# Patient Record
Sex: Female | Born: 1970 | Race: Black or African American | Hispanic: No | Marital: Single | Smoking: Current every day smoker
Health system: Southern US, Community
[De-identification: ages and names within clinical notes are randomized; demographics above are authoritative.]

## PROBLEM LIST (undated history)

## (undated) DIAGNOSIS — R32 Unspecified urinary incontinence: Secondary | ICD-10-CM

## (undated) DIAGNOSIS — Z720 Tobacco use: Secondary | ICD-10-CM

## (undated) DIAGNOSIS — F3131 Bipolar disorder, current episode depressed, mild: Secondary | ICD-10-CM

## (undated) DIAGNOSIS — L720 Epidermal cyst: Secondary | ICD-10-CM

## (undated) DIAGNOSIS — M1990 Unspecified osteoarthritis, unspecified site: Secondary | ICD-10-CM

## (undated) DIAGNOSIS — B9681 Helicobacter pylori [H. pylori] as the cause of diseases classified elsewhere: Secondary | ICD-10-CM

## (undated) DIAGNOSIS — F32A Depression, unspecified: Secondary | ICD-10-CM

## (undated) DIAGNOSIS — E785 Hyperlipidemia, unspecified: Secondary | ICD-10-CM

## (undated) DIAGNOSIS — K219 Gastro-esophageal reflux disease without esophagitis: Secondary | ICD-10-CM

## (undated) DIAGNOSIS — G629 Polyneuropathy, unspecified: Secondary | ICD-10-CM

## (undated) DIAGNOSIS — F419 Anxiety disorder, unspecified: Secondary | ICD-10-CM

## (undated) DIAGNOSIS — K589 Irritable bowel syndrome without diarrhea: Secondary | ICD-10-CM

## (undated) DIAGNOSIS — F329 Major depressive disorder, single episode, unspecified: Secondary | ICD-10-CM

## (undated) DIAGNOSIS — R0600 Dyspnea, unspecified: Secondary | ICD-10-CM

## (undated) DIAGNOSIS — E538 Deficiency of other specified B group vitamins: Secondary | ICD-10-CM

## (undated) DIAGNOSIS — K2970 Gastritis, unspecified, without bleeding: Secondary | ICD-10-CM

## (undated) DIAGNOSIS — D649 Anemia, unspecified: Secondary | ICD-10-CM

## (undated) DIAGNOSIS — J189 Pneumonia, unspecified organism: Secondary | ICD-10-CM

## (undated) DIAGNOSIS — E559 Vitamin D deficiency, unspecified: Secondary | ICD-10-CM

## (undated) DIAGNOSIS — J449 Chronic obstructive pulmonary disease, unspecified: Secondary | ICD-10-CM

## (undated) DIAGNOSIS — R7303 Prediabetes: Secondary | ICD-10-CM

## (undated) DIAGNOSIS — J45909 Unspecified asthma, uncomplicated: Secondary | ICD-10-CM

## (undated) HISTORY — PX: INCISE AND DRAIN ABCESS: PRO64

---

## 1997-11-23 ENCOUNTER — Inpatient Hospital Stay (HOSPITAL_COMMUNITY): Admission: AD | Admit: 1997-11-23 | Discharge: 1997-11-23 | Payer: Self-pay | Admitting: Obstetrics & Gynecology

## 1997-12-01 ENCOUNTER — Inpatient Hospital Stay (HOSPITAL_COMMUNITY): Admission: AD | Admit: 1997-12-01 | Discharge: 1997-12-01 | Payer: Self-pay | Admitting: Obstetrics

## 1997-12-07 ENCOUNTER — Encounter (HOSPITAL_COMMUNITY): Admission: RE | Admit: 1997-12-07 | Discharge: 1998-03-07 | Payer: Self-pay | Admitting: *Deleted

## 1997-12-16 ENCOUNTER — Inpatient Hospital Stay (HOSPITAL_COMMUNITY): Admission: AD | Admit: 1997-12-16 | Discharge: 1997-12-16 | Payer: Self-pay | Admitting: Obstetrics & Gynecology

## 1997-12-16 ENCOUNTER — Encounter: Admission: RE | Admit: 1997-12-16 | Discharge: 1998-03-16 | Payer: Self-pay | Admitting: Obstetrics

## 1997-12-28 ENCOUNTER — Inpatient Hospital Stay (HOSPITAL_COMMUNITY): Admission: AD | Admit: 1997-12-28 | Discharge: 1998-01-04 | Payer: Self-pay | Admitting: Obstetrics & Gynecology

## 1998-01-14 ENCOUNTER — Inpatient Hospital Stay (HOSPITAL_COMMUNITY): Admission: AD | Admit: 1998-01-14 | Discharge: 1998-01-14 | Payer: Self-pay | Admitting: *Deleted

## 2000-01-15 ENCOUNTER — Encounter: Payer: Self-pay | Admitting: Obstetrics

## 2000-01-15 ENCOUNTER — Inpatient Hospital Stay (HOSPITAL_COMMUNITY): Admission: AD | Admit: 2000-01-15 | Discharge: 2000-01-15 | Payer: Self-pay | Admitting: Obstetrics

## 2004-05-22 ENCOUNTER — Emergency Department: Payer: Self-pay | Admitting: Emergency Medicine

## 2005-02-19 ENCOUNTER — Emergency Department: Payer: Self-pay | Admitting: Emergency Medicine

## 2006-10-29 ENCOUNTER — Emergency Department: Payer: Self-pay | Admitting: Emergency Medicine

## 2007-07-03 ENCOUNTER — Emergency Department: Payer: Self-pay | Admitting: Emergency Medicine

## 2008-03-24 ENCOUNTER — Emergency Department: Payer: Self-pay | Admitting: Emergency Medicine

## 2008-07-20 ENCOUNTER — Emergency Department: Payer: Self-pay | Admitting: Emergency Medicine

## 2008-08-22 ENCOUNTER — Emergency Department: Payer: Self-pay | Admitting: Emergency Medicine

## 2009-01-17 ENCOUNTER — Emergency Department: Payer: Self-pay | Admitting: Emergency Medicine

## 2009-02-03 ENCOUNTER — Ambulatory Visit: Payer: Self-pay | Admitting: Surgery

## 2009-02-07 ENCOUNTER — Ambulatory Visit: Payer: Self-pay | Admitting: Surgery

## 2009-07-04 ENCOUNTER — Emergency Department: Payer: Self-pay | Admitting: Emergency Medicine

## 2009-07-07 ENCOUNTER — Emergency Department: Payer: Self-pay | Admitting: Emergency Medicine

## 2009-10-31 ENCOUNTER — Emergency Department: Payer: Self-pay | Admitting: Unknown Physician Specialty

## 2010-01-09 ENCOUNTER — Emergency Department: Payer: Self-pay | Admitting: Emergency Medicine

## 2010-03-10 ENCOUNTER — Emergency Department: Payer: Self-pay | Admitting: Emergency Medicine

## 2010-03-30 ENCOUNTER — Ambulatory Visit: Payer: Self-pay | Admitting: Otolaryngology

## 2010-10-02 ENCOUNTER — Emergency Department: Payer: Self-pay | Admitting: Internal Medicine

## 2011-03-16 ENCOUNTER — Emergency Department: Payer: Self-pay | Admitting: Emergency Medicine

## 2013-11-01 ENCOUNTER — Emergency Department: Payer: Self-pay | Admitting: Emergency Medicine

## 2014-01-01 ENCOUNTER — Emergency Department: Payer: Self-pay | Admitting: Emergency Medicine

## 2014-02-05 ENCOUNTER — Emergency Department: Payer: Self-pay | Admitting: Emergency Medicine

## 2014-02-05 LAB — CBC WITH DIFFERENTIAL/PLATELET
Basophil #: 0.1 10*3/uL (ref 0.0–0.1)
Basophil %: 0.7 %
Eosinophil #: 0.1 10*3/uL (ref 0.0–0.7)
Eosinophil %: 0.9 %
HCT: 38.9 % (ref 35.0–47.0)
HGB: 12.9 g/dL (ref 12.0–16.0)
Lymphocyte #: 3 10*3/uL (ref 1.0–3.6)
Lymphocyte %: 26.5 %
MCH: 31.8 pg (ref 26.0–34.0)
MCHC: 33.1 g/dL (ref 32.0–36.0)
MCV: 96 fL (ref 80–100)
Monocyte #: 0.5 x10 3/mm (ref 0.2–0.9)
Monocyte %: 4.6 %
Neutrophil #: 7.7 10*3/uL — ABNORMAL HIGH (ref 1.4–6.5)
Neutrophil %: 67.3 %
Platelet: 338 10*3/uL (ref 150–440)
RBC: 4.06 10*6/uL (ref 3.80–5.20)
RDW: 12.4 % (ref 11.5–14.5)
WBC: 11.4 10*3/uL — ABNORMAL HIGH (ref 3.6–11.0)

## 2014-02-05 LAB — URINALYSIS, COMPLETE
Bacteria: NONE SEEN
Bilirubin,UR: NEGATIVE
Blood: NEGATIVE
Glucose,UR: NEGATIVE mg/dL (ref 0–75)
Ketone: NEGATIVE
Leukocyte Esterase: NEGATIVE
Nitrite: NEGATIVE
Ph: 6 (ref 4.5–8.0)
Protein: NEGATIVE
RBC,UR: 2 /HPF (ref 0–5)
Specific Gravity: 1.029 (ref 1.003–1.030)
Squamous Epithelial: 9
WBC UR: 1 /HPF (ref 0–5)

## 2014-02-05 LAB — COMPREHENSIVE METABOLIC PANEL
Albumin: 3.3 g/dL — ABNORMAL LOW (ref 3.4–5.0)
Alkaline Phosphatase: 59 U/L
Anion Gap: 6 — ABNORMAL LOW (ref 7–16)
BUN: 7 mg/dL (ref 7–18)
Bilirubin,Total: 0.3 mg/dL (ref 0.2–1.0)
Calcium, Total: 8.8 mg/dL (ref 8.5–10.1)
Chloride: 106 mmol/L (ref 98–107)
Co2: 26 mmol/L (ref 21–32)
Creatinine: 0.89 mg/dL (ref 0.60–1.30)
EGFR (African American): >60
EGFR (Non-African Amer.): >60
Glucose: 108 mg/dL — ABNORMAL HIGH (ref 65–99)
Osmolality: 274 (ref 275–301)
Potassium: 3.6 mmol/L (ref 3.5–5.1)
SGOT(AST): 12 U/L — ABNORMAL LOW (ref 15–37)
SGPT (ALT): 15 U/L
Sodium: 138 mmol/L (ref 136–145)
Total Protein: 7.3 g/dL (ref 6.4–8.2)

## 2014-02-05 LAB — LIPASE, BLOOD: Lipase: 128 U/L (ref 73–393)

## 2014-02-05 LAB — PREGNANCY, URINE: Pregnancy Test, Urine: NEGATIVE m[IU]/mL

## 2014-06-01 ENCOUNTER — Ambulatory Visit: Payer: Self-pay | Admitting: Nurse Practitioner

## 2014-09-08 ENCOUNTER — Emergency Department: Payer: Self-pay | Admitting: Emergency Medicine

## 2015-05-25 ENCOUNTER — Encounter: Payer: Self-pay | Admitting: *Deleted

## 2015-05-25 ENCOUNTER — Emergency Department
Admission: EM | Admit: 2015-05-25 | Discharge: 2015-05-25 | Disposition: A | Discharge status: Home / Self Care | Payer: Medicaid Other | Attending: Emergency Medicine | Admitting: Emergency Medicine

## 2015-05-25 ENCOUNTER — Emergency Department: Payer: Medicaid Other

## 2015-05-25 DIAGNOSIS — R1011 Right upper quadrant pain: Secondary | ICD-10-CM | POA: Diagnosis present

## 2015-05-25 DIAGNOSIS — F329 Major depressive disorder, single episode, unspecified: Secondary | ICD-10-CM | POA: Insufficient documentation

## 2015-05-25 DIAGNOSIS — K2970 Gastritis, unspecified, without bleeding: Secondary | ICD-10-CM | POA: Diagnosis not present

## 2015-05-25 DIAGNOSIS — Z79899 Other long term (current) drug therapy: Secondary | ICD-10-CM | POA: Insufficient documentation

## 2015-05-25 DIAGNOSIS — Z880 Allergy status to penicillin: Secondary | ICD-10-CM | POA: Diagnosis not present

## 2015-05-25 DIAGNOSIS — F17210 Nicotine dependence, cigarettes, uncomplicated: Secondary | ICD-10-CM | POA: Diagnosis not present

## 2015-05-25 HISTORY — DX: Irritable bowel syndrome, unspecified: K58.9

## 2015-05-25 HISTORY — DX: Major depressive disorder, single episode, unspecified: F32.9

## 2015-05-25 HISTORY — DX: Depression, unspecified: F32.A

## 2015-05-25 LAB — COMPREHENSIVE METABOLIC PANEL
ALBUMIN: 3.5 g/dL (ref 3.5–5.0)
ALT: 11 U/L — AB (ref 14–54)
AST: 20 U/L (ref 15–41)
Alkaline Phosphatase: 45 U/L (ref 38–126)
Anion gap: 5 (ref 5–15)
BILIRUBIN TOTAL: 0.6 mg/dL (ref 0.3–1.2)
BUN: 7 mg/dL (ref 6–20)
CHLORIDE: 106 mmol/L (ref 101–111)
CO2: 27 mmol/L (ref 22–32)
CREATININE: 0.76 mg/dL (ref 0.44–1.00)
Calcium: 8.8 mg/dL — ABNORMAL LOW (ref 8.9–10.3)
GFR calc Af Amer: >60 mL/min (ref >60)
GLUCOSE: 94 mg/dL (ref 65–99)
POTASSIUM: 3.8 mmol/L (ref 3.5–5.1)
Sodium: 138 mmol/L (ref 135–145)
Total Protein: 6.5 g/dL (ref 6.5–8.1)

## 2015-05-25 LAB — LIPASE, BLOOD: LIPASE: 20 U/L (ref 11–51)

## 2015-05-25 LAB — URINALYSIS COMPLETE WITH MICROSCOPIC (ARMC ONLY)
BACTERIA UA: NONE SEEN
Bilirubin Urine: NEGATIVE
Glucose, UA: NEGATIVE mg/dL
HGB URINE DIPSTICK: NEGATIVE
Ketones, ur: NEGATIVE mg/dL
Leukocytes, UA: NEGATIVE
Nitrite: NEGATIVE
PH: 7 (ref 5.0–8.0)
PROTEIN: NEGATIVE mg/dL
Specific Gravity, Urine: 1.021 (ref 1.005–1.030)

## 2015-05-25 LAB — CBC
HCT: 40.7 % (ref 35.0–47.0)
HEMOGLOBIN: 13.4 g/dL (ref 12.0–16.0)
MCH: 31.2 pg (ref 26.0–34.0)
MCHC: 33 g/dL (ref 32.0–36.0)
MCV: 94.6 fL (ref 80.0–100.0)
PLATELETS: 262 10*3/uL (ref 150–440)
RBC: 4.3 MIL/uL (ref 3.80–5.20)
RDW: 13 % (ref 11.5–14.5)
WBC: 9.8 10*3/uL (ref 3.6–11.0)

## 2015-05-25 MED ORDER — ONDANSETRON 8 MG PO TBDP: 8.0000 mg | ORAL_TABLET | Freq: Three times a day (TID) | ORAL | Status: DC | PRN

## 2015-05-25 MED ORDER — RANITIDINE HCL 150 MG PO CAPS: 150.0000 mg | ORAL_CAPSULE | Freq: Two times a day (BID) | ORAL | Status: DC

## 2015-05-25 MED ORDER — HYDROMORPHONE HCL 1 MG/ML IJ SOLN
1.0000 mg | Freq: Once | INTRAMUSCULAR | Status: AC
Start: 2015-05-25 — End: 2015-05-25
  Administered 2015-05-25: 1 mg via INTRAVENOUS
  Filled 2015-05-25: qty 1

## 2015-05-25 MED ORDER — SODIUM CHLORIDE 0.9 % IV BOLUS (SEPSIS)
1000.0000 mL | Freq: Once | INTRAVENOUS | Status: AC
  Administered 2015-05-25: 1000 mL via INTRAVENOUS

## 2015-05-25 MED ORDER — FAMOTIDINE 20 MG PO TABS
40.0000 mg | ORAL_TABLET | Freq: Once | ORAL | Status: AC
  Administered 2015-05-25: 40 mg via ORAL
  Filled 2015-05-25: qty 2

## 2015-05-25 MED ORDER — ONDANSETRON HCL 4 MG/2ML IJ SOLN
4.0000 mg | Freq: Once | INTRAMUSCULAR | Status: AC
  Administered 2015-05-25: 4 mg via INTRAVENOUS
  Filled 2015-05-25: qty 2

## 2015-05-25 MED ORDER — GI COCKTAIL ~~LOC~~
30.0000 mL | ORAL | Status: AC
  Administered 2015-05-25: 30 mL via ORAL
  Filled 2015-05-25: qty 30

## 2015-05-25 NOTE — ED Notes (Signed)
Patient to ED with 2 month hx/o right sided abd pain and vomiting after eating, Patient states that she has a hx/o IBS, MD changed her medication 1 week ago and the pain has gotten worsen.

## 2015-05-25 NOTE — ED Provider Notes (Addendum)
[facility] Emergency Department Provider Note  ____________________________________________  Time seen: 9:50 AM  I have reviewed the triage vital signs and the nursing notes.   HISTORY  Chief Complaint Abdominal Pain    HPI Kim Howard is a 44 y.o. female who complains of right upper quadrant abdominal pain for the past 2 months. It is aching, nonradiating, worse with eating and associated with vomiting after eating. She does have a history of IBS but states this is worse than she generally experiences. She takes are equal and Latuda chronically for many years, without any changes in her regimen. No other complaints. No chest pain or shortness of breath, no fevers chills dizziness or syncope.     Past Medical History  Diagnosis Date  . Depression   . Irritable bowel syndrome (IBS)      There are no active problems to display for this patient.    History reviewed. No pertinent past surgical history.   Current Outpatient Rx  Name  Route  Sig  Dispense  Refill  . lubiprostone (AMITIZA) 24 MCG capsule   Oral   Take 24 mcg by mouth every 12 (twelve) hours.         Marland Kitchen lurasidone (LATUDA) 40 MG TABS tablet   Oral   Take 40 mg by mouth daily.         . QUEtiapine Fumarate (SEROQUEL XR) 150 MG 24 hr tablet   Oral   Take 150 mg by mouth at bedtime.         . ondansetron (ZOFRAN ODT) 8 MG disintegrating tablet   Oral   Take 1 tablet (8 mg total) by mouth every 8 (eight) hours as needed for nausea or vomiting.   20 tablet   0   . ranitidine (ZANTAC) 150 MG capsule   Oral   Take 1 capsule (150 mg total) by mouth 2 (two) times daily.   28 capsule   0      Allergies Penicillins   No family history on file.  Social History Social History  Substance Use Topics  . Smoking status: Current Every Day Smoker -- 1.00 packs/day    Types: Cigarettes  . Smokeless tobacco: None  . Alcohol Use: No    Review of  Systems  Constitutional:   No fever or chills. No weight changes Eyes:   No blurry vision or double vision.  ENT:   No sore throat. Cardiovascular:   No chest pain. Respiratory:   No dyspnea or cough. Gastrointestinal:   Positive as above for abdominal pain, with vomiting without diarrhea.  No BRBPR or melena. Genitourinary:   Negative for dysuria, urinary retention, bloody urine, or difficulty urinating. Musculoskeletal:   Negative for back pain. No joint swelling or pain. Skin:   Negative for rash. Neurological:   Negative for headaches, focal weakness or numbness. Psychiatric:  No anxiety , chronic controlled depression.  No SI or HI or hallucinations Endocrine:  No hot/cold intolerance, changes in energy, or sleep difficulty.  10-point ROS otherwise negative.  ____________________________________________   PHYSICAL EXAM:  VITAL SIGNS: ED Triage Vitals  Enc Vitals Group     BP 05/25/15 0852 104/66 mmHg     Pulse Rate 05/25/15 0852 84     Resp 05/25/15 0852 18     Temp 05/25/15 0852 97.7 F (36.5 C)     Temp Source 05/25/15 0852 Oral     SpO2 05/25/15 0852 99 %     Weight 05/25/15 0852  136 lb (61.689 kg)     Height 05/25/15 0852 5\' 7"  (1.702 m)     Head Cir --      Peak Flow --      Pain Score 05/25/15 0857 9     Pain Loc --      Pain Edu? --      Excl. in GC? --      Constitutional:   Alert and oriented. Well appearing and in no distress. Eyes:   No scleral icterus. No conjunctival pallor. PERRL. EOMI ENT   Head:   Normocephalic and atraumatic.   Nose:   No congestion/rhinnorhea. No septal hematoma   Mouth/Throat:   MMM, no pharyngeal erythema. No peritonsillar mass. No uvula shift.   Neck:   No stridor. No SubQ emphysema. No meningismus. Hematological/Lymphatic/Immunilogical:   No cervical lymphadenopathy. Cardiovascular:   RRR. Normal and symmetric distal pulses are present in all extremities. No murmurs, rubs, or gallops. Respiratory:   Normal  respiratory effort without tachypnea nor retractions. Breath sounds are clear and equal bilaterally. No wheezes/rales/rhonchi. Gastrointestinal:   Soft with focal right upper quadrant tenderness.. No distention. There is no CVA tenderness.  No rebound, rigidity, or guarding. Genitourinary:   deferred Musculoskeletal:   Nontender with normal range of motion in all extremities. No joint effusions.  No lower extremity tenderness.  No edema. Neurologic:   Normal speech and language.  CN 2-10 normal. Motor grossly intact. No pronator drift.  Normal gait. No gross focal neurologic deficits are appreciated.  Skin:    Skin is warm, dry and intact. No rash noted.  No petechiae, purpura, or bullae. Psychiatric:   Mood and affect are normal. Speech and behavior are normal. Patient exhibits appropriate insight and judgment.  ____________________________________________    LABS (pertinent positives/negatives) (all labs ordered are listed, but only abnormal results are displayed) Labs Reviewed  COMPREHENSIVE METABOLIC PANEL - Abnormal; Notable for the following:    Calcium 8.8 (*)    ALT 11 (*)    All other components within normal limits  URINALYSIS COMPLETEWITH MICROSCOPIC ([facility] ONLY) - Abnormal; Notable for the following:    Color, Urine YELLOW (*)    APPearance CLEAR (*)    Squamous Epithelial / LPF 0-5 (*)    All other components within normal limits  CBC  LIPASE, BLOOD   ____________________________________________   EKG    ____________________________________________    RADIOLOGY  Ultrasound right upper quadrant unremarkable except for slight biliary sludge  ____________________________________________   PROCEDURES   ____________________________________________   INITIAL IMPRESSION / ASSESSMENT AND PLAN / ED COURSE  Pertinent labs & imaging results that were available during my care of the patient were reviewed by me and considered in my medical decision making (see  chart for details).  Patient presents with postprandial pain and right upper quadrant tenderness. Concerning for biliary colic. No evidence of cholecystitis or sepsis at this time. We'll check labs and ultrasound. IV Dilaudid and Zofran for symptom relief at the time. We will also give a trial of antacids. ----------------------------------------- 11:39 AM on 05/25/2015 -----------------------------------------  Workup negative. Vital signs stable and normal. We'll treat as gastritis with a trial of antacids and anti-emetics and have her follow up with primary care. No evidence of obstruction perforation cholangitis cholecystitis. Low suspicion for torsion or STI. Feels much better after an antacids.   ____________________________________________   FINAL CLINICAL IMPRESSION(S) / ED DIAGNOSES  Final diagnoses:  RUQ abdominal pain  Gastritis      Sharman CheekPhillip Zavon Hyson,  MD 05/25/15 1139  Sharman Cheek, MD 05/25/15 [phone]

## 2015-05-25 NOTE — Discharge Instructions (Signed)

## 2015-05-25 NOTE — ED Notes (Signed)
Pt reports having irritable bowel syndrome, pt complains of right lower abdominal pain,

## 2016-10-15 ENCOUNTER — Encounter: Payer: Self-pay | Admitting: *Deleted

## 2016-10-15 ENCOUNTER — Emergency Department: Payer: Medicaid Other

## 2016-10-15 ENCOUNTER — Emergency Department
Admission: EM | Admit: 2016-10-15 | Discharge: 2016-10-15 | Disposition: A | Discharge status: Home / Self Care | Payer: Medicaid Other | Attending: Emergency Medicine | Admitting: Emergency Medicine

## 2016-10-15 DIAGNOSIS — S39012A Strain of muscle, fascia and tendon of lower back, initial encounter: Secondary | ICD-10-CM | POA: Diagnosis not present

## 2016-10-15 DIAGNOSIS — Y92410 Unspecified street and highway as the place of occurrence of the external cause: Secondary | ICD-10-CM | POA: Diagnosis not present

## 2016-10-15 DIAGNOSIS — Z79899 Other long term (current) drug therapy: Secondary | ICD-10-CM | POA: Insufficient documentation

## 2016-10-15 DIAGNOSIS — Y999 Unspecified external cause status: Secondary | ICD-10-CM | POA: Diagnosis not present

## 2016-10-15 DIAGNOSIS — S3992XA Unspecified injury of lower back, initial encounter: Secondary | ICD-10-CM | POA: Diagnosis present

## 2016-10-15 DIAGNOSIS — Y9355 Activity, bike riding: Secondary | ICD-10-CM | POA: Insufficient documentation

## 2016-10-15 DIAGNOSIS — F17210 Nicotine dependence, cigarettes, uncomplicated: Secondary | ICD-10-CM | POA: Insufficient documentation

## 2016-10-15 LAB — POC URINE PREG, ED: PREG TEST UR: NEGATIVE

## 2016-10-15 MED ORDER — METHOCARBAMOL 500 MG PO TABS: 500.0000 mg | ORAL_TABLET | Freq: Four times a day (QID) | ORAL | 0 refills | Status: DC

## 2016-10-15 MED ORDER — MELOXICAM 15 MG PO TABS: 15.0000 mg | ORAL_TABLET | Freq: Every day | ORAL | 0 refills | Status: DC

## 2016-10-15 MED ORDER — KETOROLAC TROMETHAMINE 30 MG/ML IJ SOLN
30.0000 mg | Freq: Once | INTRAMUSCULAR | Status: AC
  Administered 2016-10-15: 30 mg via INTRAMUSCULAR
  Filled 2016-10-15: qty 1

## 2016-10-15 MED ORDER — ORPHENADRINE CITRATE 30 MG/ML IJ SOLN
60.0000 mg | Freq: Once | INTRAMUSCULAR | Status: AC
  Administered 2016-10-15: 60 mg via INTRAMUSCULAR
  Filled 2016-10-15: qty 2
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
## 2016-10-26 ENCOUNTER — Encounter: Payer: Self-pay | Admitting: Emergency Medicine

## 2016-10-26 ENCOUNTER — Emergency Department: Payer: Medicaid Other

## 2016-10-26 ENCOUNTER — Emergency Department
Admission: EM | Admit: 2016-10-26 | Discharge: 2016-10-26 | Disposition: A | Discharge status: Home / Self Care | Payer: Medicaid Other | Attending: Emergency Medicine | Admitting: Emergency Medicine

## 2016-10-26 DIAGNOSIS — S62306A Unspecified fracture of fifth metacarpal bone, right hand, initial encounter for closed fracture: Secondary | ICD-10-CM | POA: Diagnosis not present

## 2016-10-26 DIAGNOSIS — Y9389 Activity, other specified: Secondary | ICD-10-CM | POA: Insufficient documentation

## 2016-10-26 DIAGNOSIS — Y929 Unspecified place or not applicable: Secondary | ICD-10-CM | POA: Diagnosis not present

## 2016-10-26 DIAGNOSIS — W2201XA Walked into wall, initial encounter: Secondary | ICD-10-CM | POA: Insufficient documentation

## 2016-10-26 DIAGNOSIS — S62339A Displaced fracture of neck of unspecified metacarpal bone, initial encounter for closed fracture: Secondary | ICD-10-CM

## 2016-10-26 DIAGNOSIS — Z79899 Other long term (current) drug therapy: Secondary | ICD-10-CM | POA: Diagnosis not present

## 2016-10-26 DIAGNOSIS — F17210 Nicotine dependence, cigarettes, uncomplicated: Secondary | ICD-10-CM | POA: Insufficient documentation

## 2016-10-26 DIAGNOSIS — Y999 Unspecified external cause status: Secondary | ICD-10-CM | POA: Diagnosis not present

## 2016-10-26 DIAGNOSIS — S6991XA Unspecified injury of right wrist, hand and finger(s), initial encounter: Secondary | ICD-10-CM | POA: Diagnosis present

## 2016-10-26 MED ORDER — MELOXICAM 15 MG PO TABS: 15.0000 mg | ORAL_TABLET | Freq: Every day | ORAL | 0 refills | Status: DC

## 2016-10-26 MED ORDER — HYDROCODONE-ACETAMINOPHEN 5-325 MG PO TABS: 1.0000 | ORAL_TABLET | ORAL | 0 refills | Status: DC | PRN

## 2016-10-26 MED ORDER — HYDROMORPHONE HCL 1 MG/ML IJ SOLN
1.0000 mg | Freq: Once | INTRAMUSCULAR | Status: AC
  Administered 2016-10-26: 1 mg via INTRAMUSCULAR
  Filled 2016-10-26: qty 1
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
## 2016-11-22 ENCOUNTER — Ambulatory Visit: Payer: Medicaid Other | Attending: Physical Therapy | Admitting: Physical Therapy

## 2017-01-12 DIAGNOSIS — F17210 Nicotine dependence, cigarettes, uncomplicated: Secondary | ICD-10-CM | POA: Diagnosis not present

## 2017-01-12 DIAGNOSIS — N739 Female pelvic inflammatory disease, unspecified: Secondary | ICD-10-CM | POA: Diagnosis not present

## 2017-01-12 DIAGNOSIS — N939 Abnormal uterine and vaginal bleeding, unspecified: Secondary | ICD-10-CM | POA: Diagnosis not present

## 2017-01-12 DIAGNOSIS — R1030 Lower abdominal pain, unspecified: Secondary | ICD-10-CM | POA: Insufficient documentation

## 2017-01-12 DIAGNOSIS — Z791 Long term (current) use of non-steroidal anti-inflammatories (NSAID): Secondary | ICD-10-CM | POA: Diagnosis not present

## 2017-01-12 DIAGNOSIS — Z79899 Other long term (current) drug therapy: Secondary | ICD-10-CM | POA: Diagnosis not present

## 2017-01-12 LAB — URINALYSIS, COMPLETE (UACMP) WITH MICROSCOPIC
BACTERIA UA: NONE SEEN
Bilirubin Urine: NEGATIVE
Glucose, UA: NEGATIVE mg/dL
Ketones, ur: NEGATIVE mg/dL
Leukocytes, UA: NEGATIVE
NITRITE: NEGATIVE
Protein, ur: NEGATIVE mg/dL
SPECIFIC GRAVITY, URINE: 1.023 (ref 1.005–1.030)
pH: 5 (ref 5.0–8.0)

## 2017-01-12 LAB — COMPREHENSIVE METABOLIC PANEL
ALBUMIN: 3.8 g/dL (ref 3.5–5.0)
ALK PHOS: 60 U/L (ref 38–126)
ALT: 16 U/L (ref 14–54)
AST: 24 U/L (ref 15–41)
Anion gap: 10 (ref 5–15)
BILIRUBIN TOTAL: 0.4 mg/dL (ref 0.3–1.2)
BUN: 9 mg/dL (ref 6–20)
CALCIUM: 9.3 mg/dL (ref 8.9–10.3)
CO2: 23 mmol/L (ref 22–32)
CREATININE: 0.62 mg/dL (ref 0.44–1.00)
Chloride: 105 mmol/L (ref 101–111)
GFR calc Af Amer: >60 mL/min (ref >60)
GFR calc non Af Amer: >60 mL/min (ref >60)
GLUCOSE: 93 mg/dL (ref 65–99)
Potassium: 3.3 mmol/L — ABNORMAL LOW (ref 3.5–5.1)
Sodium: 138 mmol/L (ref 135–145)
TOTAL PROTEIN: 7.3 g/dL (ref 6.5–8.1)

## 2017-01-12 LAB — CBC
HEMATOCRIT: 40.1 % (ref 35.0–47.0)
Hemoglobin: 13.8 g/dL (ref 12.0–16.0)
MCH: 32.9 pg (ref 26.0–34.0)
MCHC: 34.4 g/dL (ref 32.0–36.0)
MCV: 95.7 fL (ref 80.0–100.0)
PLATELETS: 275 10*3/uL (ref 150–440)
RBC: 4.19 MIL/uL (ref 3.80–5.20)
RDW: 12 % (ref 11.5–14.5)
WBC: 13.2 10*3/uL — ABNORMAL HIGH (ref 3.6–11.0)

## 2017-01-12 LAB — POCT PREGNANCY, URINE: PREG TEST UR: NEGATIVE

## 2017-01-12 MED ORDER — FENTANYL CITRATE (PF) 100 MCG/2ML IJ SOLN
INTRAMUSCULAR | Status: AC
  Filled 2017-01-12: qty 2

## 2017-01-12 MED ORDER — FENTANYL CITRATE (PF) 100 MCG/2ML IJ SOLN
50.0000 ug | INTRAMUSCULAR | Status: DC | PRN
  Administered 2017-01-12: 50 ug via INTRAVENOUS

## 2017-01-12 MED ORDER — ONDANSETRON HCL 4 MG/2ML IJ SOLN
4.0000 mg | Freq: Once | INTRAMUSCULAR | Status: AC | PRN
  Administered 2017-01-12: 4 mg via INTRAVENOUS
  Filled 2017-01-12: qty 2

## 2017-01-12 NOTE — ED Triage Notes (Signed)
Pt states she has vaginal bleeding, lower abd pain and a positive pregancy test at home. Pt states "it feels like something is tearing my stomach plumb apart." pt appears in distress, skin normal color warm and dry.

## 2017-01-13 ENCOUNTER — Emergency Department: Payer: Medicaid Other

## 2017-01-13 ENCOUNTER — Encounter: Payer: Self-pay | Admitting: Radiology

## 2017-01-13 ENCOUNTER — Emergency Department
Admission: EM | Admit: 2017-01-13 | Discharge: 2017-01-13 | Disposition: A | Discharge status: Home / Self Care | Payer: Medicaid Other | Attending: Emergency Medicine | Admitting: Emergency Medicine

## 2017-01-13 DIAGNOSIS — R109 Unspecified abdominal pain: Secondary | ICD-10-CM

## 2017-01-13 DIAGNOSIS — N939 Abnormal uterine and vaginal bleeding, unspecified: Secondary | ICD-10-CM

## 2017-01-13 DIAGNOSIS — N739 Female pelvic inflammatory disease, unspecified: Secondary | ICD-10-CM

## 2017-01-13 LAB — CHLAMYDIA/NGC RT PCR (ARMC ONLY)
Chlamydia Tr: NOT DETECTED
N GONORRHOEAE: NOT DETECTED

## 2017-01-13 LAB — WET PREP, GENITAL
Clue Cells Wet Prep HPF POC: NONE SEEN
SPERM: NONE SEEN
TRICH WET PREP: NONE SEEN
Yeast Wet Prep HPF POC: NONE SEEN

## 2017-01-13 MED ORDER — DOXYCYCLINE HYCLATE 100 MG PO TABS
100.0000 mg | ORAL_TABLET | Freq: Two times a day (BID) | ORAL | 0 refills | Status: DC
Start: 2017-01-13 — End: 2017-06-02

## 2017-01-13 MED ORDER — MORPHINE SULFATE (PF) 4 MG/ML IV SOLN
INTRAVENOUS | Status: AC
  Filled 2017-01-13: qty 1

## 2017-01-13 MED ORDER — KETOROLAC TROMETHAMINE 30 MG/ML IJ SOLN
30.0000 mg | Freq: Once | INTRAMUSCULAR | Status: AC
  Administered 2017-01-13: 30 mg via INTRAVENOUS
  Filled 2017-01-13: qty 1

## 2017-01-13 MED ORDER — SODIUM CHLORIDE 0.9 % IV BOLUS (SEPSIS)
1000.0000 mL | Freq: Once | INTRAVENOUS | Status: AC
  Administered 2017-01-13: 1000 mL via INTRAVENOUS

## 2017-01-13 MED ORDER — ETODOLAC 200 MG PO CAPS: 200.0000 mg | ORAL_CAPSULE | Freq: Three times a day (TID) | ORAL | 0 refills | Status: DC

## 2017-01-13 MED ORDER — MORPHINE SULFATE (PF) 4 MG/ML IV SOLN
4.0000 mg | Freq: Once | INTRAVENOUS | Status: AC
Start: 2017-01-13 — End: 2017-01-13
  Administered 2017-01-13: 4 mg via INTRAVENOUS

## 2017-01-13 MED ORDER — IOPAMIDOL (ISOVUE-300) INJECTION 61%
100.0000 mL | Freq: Once | INTRAVENOUS | Status: AC | PRN
  Administered 2017-01-13: 100 mL via INTRAVENOUS

## 2017-01-13 MED ORDER — TRAMADOL HCL 50 MG PO TABS
50.0000 mg | ORAL_TABLET | Freq: Four times a day (QID) | ORAL | 0 refills | Status: DC | PRN
Start: 2017-01-13 — End: 2017-06-02

## 2017-01-13 MED ORDER — AZITHROMYCIN 1 G PO PACK
1.0000 g | PACK | Freq: Once | ORAL | Status: AC
  Administered 2017-01-13: 1 g via ORAL
  Filled 2017-01-13: qty 1

## 2017-01-13 MED ORDER — DOXYCYCLINE HYCLATE 100 MG PO TABS
100.0000 mg | ORAL_TABLET | Freq: Once | ORAL | Status: AC
  Administered 2017-01-13: 100 mg via ORAL
  Filled 2017-01-13: qty 1

## 2017-01-13 NOTE — ED Notes (Signed)
Patient transported to CT 

## 2017-01-13 NOTE — ED Provider Notes (Signed)
[facility] Emergency Department Provider Note   ____________________________________________   First MD Initiated Contact with Patient 01/13/17 [phone]     (approximate)  I have reviewed Kim triage vital signs and Kim nursing notes.   HISTORY  Chief Complaint Abdominal Pain and Vaginal Bleeding    HPI Kim Howard is a 46 y.o. female who comes into Kim hospital today with some lower abdominal pain. Kim patient has a history of a prolapsed uterus from recent pregnancies. Kim patient reports that she's been having abdominal pain for Kim past 3-4 days. She's been putting it off because she didn't want to miss work but today she was vomiting all day and she had some nausea. Kim patient has been taking ibuprofen which has not been helping. She also started having some vaginal bleeding. Kim patient reports that she has an Implanon and has not had a period never long time. She reports her pain is currently a 5-6 out of 10 in intensity. She denies any vaginal discharge or pain with urination. Kim patient reports that she does want his primary care physician and everything was checked up and she was fine. Kim patient is here today for evaluation of her symptoms.   Past Medical History:  Diagnosis Date  . Depression   . Irritable bowel syndrome (IBS)     There are no active problems to display for this patient.   History reviewed. No pertinent surgical history.  Prior to Admission medications   Medication Sig Start Date End Date Taking? Authorizing Provider  doxycycline (VIBRA-TABS) 100 MG tablet Take 1 tablet (100 mg total) by mouth 2 (two) times daily. 01/13/17   Rebecka Apley, MD  etodolac (LODINE) 200 MG capsule Take 1 capsule (200 mg total) by mouth every 8 (eight) hours. 01/13/17   Rebecka Apley, MD  HYDROcodone-acetaminophen (NORCO/VICODIN) 5-325 MG tablet Take 1 tablet by mouth every 4 (four) hours as needed for moderate pain. 10/26/16 10/26/17   Triplett, Kasandra Knudsen, FNP  lubiprostone (AMITIZA) 24 MCG capsule Take 24 mcg by mouth every 12 (twelve) hours.    [provider]  lurasidone (LATUDA) 40 MG TABS tablet Take 40 mg by mouth daily.    [provider]  meloxicam (MOBIC) 15 MG tablet Take 1 tablet (15 mg total) by mouth daily. 10/26/16   Triplett, Rulon Eisenmenger B, FNP  methocarbamol (ROBAXIN) 500 MG tablet Take 1 tablet (500 mg total) by mouth 4 (four) times daily. 10/15/16   Cuthriell, Delorise Royals, PA-C  ondansetron (ZOFRAN ODT) 8 MG disintegrating tablet Take 1 tablet (8 mg total) by mouth every 8 (eight) hours as needed for nausea or vomiting. 05/25/15   Sharman Cheek, MD  QUEtiapine Fumarate (SEROQUEL XR) 150 MG 24 hr tablet Take 150 mg by mouth at bedtime.    [provider]  ranitidine (ZANTAC) 150 MG capsule Take 1 capsule (150 mg total) by mouth 2 (two) times daily. 05/25/15   Sharman Cheek, MD  traMADol (ULTRAM) 50 MG tablet Take 1 tablet (50 mg total) by mouth every 6 (six) hours as needed. 01/13/17   Rebecka Apley, MD    Allergies Penicillins  No family history on file.  Social History Social History  Substance Use Topics  . Smoking status: Current Every Day Smoker    Packs/day: 1.00    Types: Cigarettes  . Smokeless tobacco: Never Used  . Alcohol use No    Review of Systems  Constitutional: No fever/chills Eyes: No visual changes.  ENT: No sore throat. Cardiovascular: Denies chest pain. Respiratory: Denies shortness of breath. Gastrointestinal:  abdominal pain, nausea, vomiting.  No diarrhea.  No constipation. Genitourinary: vaginal bleeding Musculoskeletal: Negative for back pain. Skin: Negative for rash. Neurological: Negative for headaches, focal weakness or numbness.   ____________________________________________   PHYSICAL EXAM:  VITAL SIGNS: ED Triage Vitals  Enc Vitals Group     BP 01/12/17 2230 (!) 142/93     Pulse Rate 01/12/17 2230 66     Resp 01/12/17 2230 16       Temp 01/12/17 2230 98.5 F (36.9 C)     Temp Source 01/12/17 2230 Oral     SpO2 01/12/17 2230 100 %     Weight 01/12/17 2231 136 lb (61.7 kg)     Height 01/12/17 2231 5\' 7"  (1.702 m)     Head Circumference --      Peak Flow --      Pain Score 01/12/17 2230 10     Pain Loc --      Pain Edu? --      Excl. in GC? --     Constitutional: Alert and oriented. Well appearing and in moderate distress. Eyes: Conjunctivae are normal. PERRL. EOMI. Head: Atraumatic. Nose: No congestion/rhinnorhea. Mouth/Throat: Mucous membranes are moist.  Oropharynx non-erythematous. Cardiovascular: Normal rate, regular rhythm. Grossly normal heart sounds.  Good peripheral circulation. Respiratory: Normal respiratory effort.  No retractions. Lungs CTAB. Gastrointestinal: Soft with lower abd tenderness to palpation. No distention. Positive bowel sounds Genitourinary: Normal external genitalia, no cervical motion tenderness to palpation, uterine or adnexal tenderness to palpation in Kim patient's uterus feels slightly enlarged. Moderate blood in vaginal vault Musculoskeletal: No lower extremity tenderness nor edema.   Neurologic:  Normal speech and language.  Skin:  Skin is warm, dry and intact.  Psychiatric: Mood and affect are normal.   ____________________________________________   LABS (all labs ordered are listed, but only abnormal results are displayed)  Labs Reviewed  WET PREP, GENITAL - Abnormal; Notable for Kim following:       Result Value   WBC, Wet Prep HPF POC FEW (*)    All other components within normal limits  COMPREHENSIVE METABOLIC PANEL - Abnormal; Notable for Kim following:    Potassium 3.3 (*)    All other components within normal limits  CBC - Abnormal; Notable for Kim following:    WBC 13.2 (*)    All other components within normal limits  URINALYSIS, COMPLETE (UACMP) WITH MICROSCOPIC - Abnormal; Notable for Kim following:    Color, Urine YELLOW (*)    APPearance HAZY (*)     Hgb urine dipstick LARGE (*)    Squamous Epithelial / LPF 0-5 (*)    All other components within normal limits  CHLAMYDIA/NGC RT PCR ([facility] ONLY)  POC URINE PREG, ED  POCT PREGNANCY, URINE   ____________________________________________  EKG  Howard ____________________________________________  RADIOLOGY  US Transvaginal Non-ob  Result Date: 01/13/2017 CLINICAL DATA:  46 year old female with lower abdominal pain and cramping. Negative urine pregnancy test. LMP: 01/12/2017 EXAM: TRANSABDOMINAL AND TRANSVAGINAL ULTRASOUND OF PELVIS DOPPLER ULTRASOUND OF OVARIES TECHNIQUE: Both transabdominal and transvaginal ultrasound examinations of Kim pelvis were performed. Transabdominal technique was performed for global imaging of Kim pelvis including uterus, ovaries, adnexal regions, and pelvic cul-de-sac. It was necessary to proceed with endovaginal exam following Kim transabdominal exam to visualize Kim endometrium and Kim ovaries. Color and duplex Doppler ultrasound was utilized to evaluate blood flow to Kim ovaries. COMPARISON:  Howard.  FINDINGS: Uterus Measurements: 10.4 x 5.3 x 6.1 cm. No fibroids or other mass visualized. Endometrium Thickness: 2 mm.  No focal abnormality visualized. Right ovary Measurements: 2.9 x 1.8 x 2.3 cm. Normal appearance/no adnexal mass. Left ovary Measurements: 2.9 x 1.3 x 1.5 cm. Normal appearance/no adnexal mass. Pulsed Doppler evaluation of both ovaries demonstrates normal low-resistance arterial and venous waveforms. Other findings No abnormal free fluid. IMPRESSION: Unremarkable pelvic ultrasound. Electronically Signed   By: Elgie CollardArash  Radparvar M.D.   On: 01/13/2017 02:29   [place] Pelvis Complete  Result Date: 01/13/2017 CLINICAL DATA:  46 year old female with lower abdominal pain and cramping. Negative urine pregnancy test. LMP: 01/12/2017 EXAM: TRANSABDOMINAL AND TRANSVAGINAL ULTRASOUND OF PELVIS DOPPLER ULTRASOUND OF OVARIES TECHNIQUE: Both transabdominal and  transvaginal ultrasound examinations of Kim pelvis were performed. Transabdominal technique was performed for global imaging of Kim pelvis including uterus, ovaries, adnexal regions, and pelvic cul-de-sac. It was necessary to proceed with endovaginal exam following Kim transabdominal exam to visualize Kim endometrium and Kim ovaries. Color and duplex Doppler ultrasound was utilized to evaluate blood flow to Kim ovaries. COMPARISON:  Howard. FINDINGS: Uterus Measurements: 10.4 x 5.3 x 6.1 cm. No fibroids or other mass visualized. Endometrium Thickness: 2 mm.  No focal abnormality visualized. Right ovary Measurements: 2.9 x 1.8 x 2.3 cm. Normal appearance/no adnexal mass. Left ovary Measurements: 2.9 x 1.3 x 1.5 cm. Normal appearance/no adnexal mass. Pulsed Doppler evaluation of both ovaries demonstrates normal low-resistance arterial and venous waveforms. Other findings No abnormal free fluid. IMPRESSION: Unremarkable pelvic ultrasound. Electronically Signed   By: Elgie CollardArash  Radparvar M.D.   On: 01/13/2017 02:29   Ct Abdomen Pelvis W Contrast  Result Date: 01/13/2017 CLINICAL DATA:  46 year old female with lower abdominal pain and cramping. EXAM: CT ABDOMEN AND PELVIS WITH CONTRAST TECHNIQUE: Multidetector CT imaging of Kim abdomen and pelvis was performed using Kim standard protocol following bolus administration of intravenous contrast. CONTRAST:  100mL ISOVUE-300 IOPAMIDOL (ISOVUE-300) INJECTION 61% COMPARISON:  Pelvic ultrasound dated 01/13/2017 and CT of Kim abdomen pelvis dated 10/29/2006 FINDINGS: Lower chest: Kim visualized lung bases are clear. No intra-abdominal free air. Probable trace free fluid within Kim pelvis. Hepatobiliary: There is a 3.3 x 3.1 cm area of increased arterial phase enhancement in Kim left lobe of Kim liver adjacent to Kim falciform ligament. This area demonstrates similar enhancement to Kim remainder of Kim liver parenchyma on delayed images. There is no associated mass effect or  displacement of Kim vasculature. This most likely represent an area of fat sparing. Underlying mass such as FNH is less likely. MRI may provide better characterization if clinically indicated. There is no intrahepatic biliary ductal dilatation. Kim gallbladder is unremarkable. Pancreas: Unremarkable. No pancreatic ductal dilatation or surrounding inflammatory changes. Spleen: Normal in size without focal abnormality. Adrenals/Urinary Tract: Adrenal glands are unremarkable. Kidneys are normal, without renal calculi, focal lesion, or hydronephrosis. There is hazy appearance of Kim bladder wall which may relate related to inflammatory changes of Kim pelvis or represent cystitis. Correlation with clinical exam and urinalysis recommended. Stomach/Bowel: There is mild thickened appearance of Kim gastric rugal and proximal small-bowel folds which may be related to underdistention. Gastroenteritis is less likely but not excluded. Clinical correlation is recommended. There is no bowel dilatation or evidence of obstruction. Kim colon appears unremarkable. Kim appendix is normal. Vascular/Lymphatic: No significant vascular findings are present. No enlarged abdominal or pelvic lymph nodes. Reproductive: Kim uterus is anteverted and appears unremarkable. Kim ovaries appear grossly unremarkable as visualized. No pelvic masses.  Small amount of fluid is noted within Kim upper vagina and Kim fornix. There is diffuse haziness of Kim pelvic floor fat and upper vagina. Correlation with clinical exam is recommended to evaluate for an infectious/inflammatory process involving Kim vagina or cervical region. There is no drainable fluid collection or abscess. Other: Howard Musculoskeletal: No acute or significant osseous findings. IMPRESSION: 1. Diffuse haziness of Kim pelvic floor fat and thickened appearance of Kim upper vagina with a small amount of fluid in Kim vaginal fornix. There is also diffuse thickened appearance of Kim bladder  wall. Findings concerning for an infectious process originating from Kim vagina or cervical region versus cystitis. Correlation with clinical exam and urinalysis recommended. No fluid collection or abscess. 2. Thickened appearance of Kim gastric and proximal small bowel folds likely related to underdistention. Gastroenteritis is less likely. No bowel obstruction. Normal appendix. 3. Focal enhancement of left lobe of Kim liver along Kim falciform ligament in Kim arterial phase, likely related to focal fatty sparing. No associated mass effect. MRI may provide better characterization if clinically indicated. Electronically Signed   By: Elgie Collard M.D.   On: 01/13/2017 04:01   [place] Art/ven Flow Abd Pelv Doppler  Result Date: 01/13/2017 CLINICAL DATA:  46 year old female with lower abdominal pain and cramping. Negative urine pregnancy test. LMP: 01/12/2017 EXAM: TRANSABDOMINAL AND TRANSVAGINAL ULTRASOUND OF PELVIS DOPPLER ULTRASOUND OF OVARIES TECHNIQUE: Both transabdominal and transvaginal ultrasound examinations of Kim pelvis were performed. Transabdominal technique was performed for global imaging of Kim pelvis including uterus, ovaries, adnexal regions, and pelvic cul-de-sac. It was necessary to proceed with endovaginal exam following Kim transabdominal exam to visualize Kim endometrium and Kim ovaries. Color and duplex Doppler ultrasound was utilized to evaluate blood flow to Kim ovaries. COMPARISON:  Howard. FINDINGS: Uterus Measurements: 10.4 x 5.3 x 6.1 cm. No fibroids or other mass visualized. Endometrium Thickness: 2 mm.  No focal abnormality visualized. Right ovary Measurements: 2.9 x 1.8 x 2.3 cm. Normal appearance/no adnexal mass. Left ovary Measurements: 2.9 x 1.3 x 1.5 cm. Normal appearance/no adnexal mass. Pulsed Doppler evaluation of both ovaries demonstrates normal low-resistance arterial and venous waveforms. Other findings No abnormal free fluid. IMPRESSION: Unremarkable pelvic ultrasound.  Electronically Signed   By: Elgie Collard M.D.   On: 01/13/2017 02:29    ____________________________________________   PROCEDURES  Procedure(s) performed: Howard  Procedures  Critical Care performed: No  ____________________________________________   INITIAL IMPRESSION / ASSESSMENT AND PLAN / ED COURSE  Pertinent labs & imaging results that were available during my care of Kim patient were reviewed by me and considered in my medical decision making (see chart for details).  This is a 46 year old female who comes into Kim hospital today with some abdominal pain. Kim patient is also been having some vaginal bleeding. I did send Kim patient for an ultrasound initially which was unremarkable. I gave Kim patient shot of Toradol as well as morphine. Kim patient had a CT scan which showed some haziness in Kim pelvis with a concern for a pelvic infection. Kim patient's wet prep was negative as was her GC and chlamydia but given Kim patient's discomfort I will give Kim patient some azithromycin as well as doxycycline. I did inform Kim patient of her results and I informed her that she will need to follow back up with an OB/GYN. I did give her Kim follow-up information for Dr. Valentino Saxon and she did feel improved. Kim patient be discharged to home with an unspecified pelvic infection/cervicitis. Kim patient has  been instructed to return with any worsened symptoms.       ____________________________________________   FINAL CLINICAL IMPRESSION(S) / ED DIAGNOSES  Final diagnoses:  Abdominal pain  Vaginal bleeding  Pelvic infection in female      NEW MEDICATIONS STARTED DURING THIS VISIT:  Discharge Medication List as of 01/13/2017  5:05 AM    START taking these medications   Details  doxycycline (VIBRA-TABS) 100 MG tablet Take 1 tablet (100 mg total) by mouth 2 (two) times daily., Starting Sun 01/13/2017, Print    etodolac (LODINE) 200 MG capsule Take 1 capsule (200 mg total) by mouth  every 8 (eight) hours., Starting Sun 01/13/2017, Print    traMADol (ULTRAM) 50 MG tablet Take 1 tablet (50 mg total) by mouth every 6 (six) hours as needed., Starting Sun 01/13/2017, Print         Note:  This document was prepared using Dragon voice recognition software and may include unintentional dictation errors.    Rebecka Apley, MD 01/13/17 [phone]

## 2017-01-13 NOTE — Discharge Instructions (Signed)
Please follow up with your OB/GYN for further evaluation of your symptoms.  °

## 2017-01-13 NOTE — ED Notes (Signed)
Patient transported to Ultrasound 

## 2017-01-13 NOTE — ED Notes (Signed)
Patient c/o lower abdominal cramps X 3 days. Pt c/o heavy abdominal bleeding with clots beginning today. Patient c/o N/V today.

## 2017-05-14 DIAGNOSIS — A048 Other specified bacterial intestinal infections: Secondary | ICD-10-CM | POA: Insufficient documentation

## 2017-05-31 ENCOUNTER — Emergency Department: Payer: Medicaid Other

## 2017-05-31 ENCOUNTER — Inpatient Hospital Stay
Admission: EM | Admit: 2017-05-31 | Discharge: 2017-06-02 | DRG: 195 | Disposition: A | Discharge status: Home / Self Care | Payer: Medicaid Other | Attending: Internal Medicine | Admitting: Internal Medicine

## 2017-05-31 ENCOUNTER — Other Ambulatory Visit: Payer: Self-pay

## 2017-05-31 DIAGNOSIS — J209 Acute bronchitis, unspecified: Secondary | ICD-10-CM

## 2017-05-31 DIAGNOSIS — F17210 Nicotine dependence, cigarettes, uncomplicated: Secondary | ICD-10-CM | POA: Diagnosis present

## 2017-05-31 DIAGNOSIS — R0602 Shortness of breath: Secondary | ICD-10-CM | POA: Diagnosis present

## 2017-05-31 DIAGNOSIS — J984 Other disorders of lung: Secondary | ICD-10-CM | POA: Diagnosis present

## 2017-05-31 DIAGNOSIS — Z79899 Other long term (current) drug therapy: Secondary | ICD-10-CM

## 2017-05-31 DIAGNOSIS — J189 Pneumonia, unspecified organism: Secondary | ICD-10-CM | POA: Diagnosis present

## 2017-05-31 DIAGNOSIS — F329 Major depressive disorder, single episode, unspecified: Secondary | ICD-10-CM | POA: Diagnosis present

## 2017-05-31 HISTORY — DX: Gastritis, unspecified, without bleeding: K29.70

## 2017-05-31 HISTORY — DX: Helicobacter pylori (H. pylori) as the cause of diseases classified elsewhere: B96.81

## 2017-05-31 HISTORY — DX: Tobacco use: Z72.0

## 2017-05-31 LAB — URINE DRUG SCREEN, QUALITATIVE (ARMC ONLY)
AMPHETAMINES, UR SCREEN: NOT DETECTED
BARBITURATES, UR SCREEN: NOT DETECTED
Benzodiazepine, Ur Scrn: NOT DETECTED
Cannabinoid 50 Ng, Ur ~~LOC~~: POSITIVE — AB
Cocaine Metabolite,Ur ~~LOC~~: NOT DETECTED
MDMA (Ecstasy)Ur Screen: NOT DETECTED
METHADONE SCREEN, URINE: NOT DETECTED
Opiate, Ur Screen: POSITIVE — AB
Phencyclidine (PCP) Ur S: NOT DETECTED
TRICYCLIC, UR SCREEN: NOT DETECTED

## 2017-05-31 LAB — BASIC METABOLIC PANEL
Anion gap: 11 (ref 5–15)
BUN: 11 mg/dL (ref 6–20)
CO2: 20 mmol/L — ABNORMAL LOW (ref 22–32)
CREATININE: 0.72 mg/dL (ref 0.44–1.00)
Calcium: 9 mg/dL (ref 8.9–10.3)
Chloride: 107 mmol/L (ref 101–111)
GFR calc Af Amer: >60 mL/min (ref >60)
Glucose, Bld: 101 mg/dL — ABNORMAL HIGH (ref 65–99)
POTASSIUM: 3.5 mmol/L (ref 3.5–5.1)
SODIUM: 138 mmol/L (ref 135–145)

## 2017-05-31 LAB — CBC
HCT: 37.2 % (ref 35.0–47.0)
Hemoglobin: 12.5 g/dL (ref 12.0–16.0)
MCH: 32 pg (ref 26.0–34.0)
MCHC: 33.5 g/dL (ref 32.0–36.0)
MCV: 95.4 fL (ref 80.0–100.0)
PLATELETS: 277 10*3/uL (ref 150–440)
RBC: 3.9 MIL/uL (ref 3.80–5.20)
RDW: 12.7 % (ref 11.5–14.5)
WBC: 10.2 10*3/uL (ref 3.6–11.0)

## 2017-05-31 LAB — TROPONIN I: Troponin I: <0.03 ng/mL (ref <0.03)

## 2017-05-31 MED ORDER — IPRATROPIUM-ALBUTEROL 0.5-2.5 (3) MG/3ML IN SOLN
RESPIRATORY_TRACT | Status: AC
Start: 2017-05-31 — End: 2017-05-31
  Filled 2017-05-31: qty 3

## 2017-05-31 MED ORDER — ONDANSETRON HCL 4 MG PO TABS: 4.0000 mg | ORAL_TABLET | Freq: Four times a day (QID) | ORAL | Status: DC | PRN

## 2017-05-31 MED ORDER — ALBUTEROL SULFATE (2.5 MG/3ML) 0.083% IN NEBU
INHALATION_SOLUTION | RESPIRATORY_TRACT | Status: AC
  Filled 2017-05-31: qty 3

## 2017-05-31 MED ORDER — ALBUTEROL SULFATE (2.5 MG/3ML) 0.083% IN NEBU
INHALATION_SOLUTION | RESPIRATORY_TRACT | Status: AC
  Filled 2017-05-31: qty 9

## 2017-05-31 MED ORDER — IPRATROPIUM-ALBUTEROL 0.5-2.5 (3) MG/3ML IN SOLN
RESPIRATORY_TRACT | Status: AC
  Filled 2017-05-31: qty 6

## 2017-05-31 MED ORDER — ONDANSETRON HCL 4 MG/2ML IJ SOLN: 4.0000 mg | Freq: Four times a day (QID) | INTRAMUSCULAR | Status: DC | PRN

## 2017-05-31 MED ORDER — ALBUTEROL SULFATE (2.5 MG/3ML) 0.083% IN NEBU
2.5000 mg | INHALATION_SOLUTION | Freq: Once | RESPIRATORY_TRACT | Status: AC
  Administered 2017-05-31: 2.5 mg via RESPIRATORY_TRACT

## 2017-05-31 MED ORDER — IPRATROPIUM-ALBUTEROL 0.5-2.5 (3) MG/3ML IN SOLN
3.0000 mL | Freq: Once | RESPIRATORY_TRACT | Status: AC
  Administered 2017-05-31: 3 mL via RESPIRATORY_TRACT

## 2017-05-31 MED ORDER — ACETAMINOPHEN 325 MG PO TABS: 650.0000 mg | ORAL_TABLET | Freq: Four times a day (QID) | ORAL | Status: DC | PRN

## 2017-05-31 MED ORDER — METHYLPREDNISOLONE SODIUM SUCC 125 MG IJ SOLR
125.0000 mg | Freq: Once | INTRAMUSCULAR | Status: AC
  Administered 2017-05-31: 06:00:00 via INTRAVENOUS

## 2017-05-31 MED ORDER — ENOXAPARIN SODIUM 40 MG/0.4ML ~~LOC~~ SOLN
40.0000 mg | SUBCUTANEOUS | Status: DC
Start: 2017-05-31 — End: 2017-06-02
  Administered 2017-05-31 – 2017-06-01 (×2): 40 mg via SUBCUTANEOUS
  Filled 2017-05-31 (×2): qty 0.4

## 2017-05-31 MED ORDER — HYDROCOD POLST-CPM POLST ER 10-8 MG/5ML PO SUER
5.0000 mL | Freq: Once | ORAL | Status: AC
  Administered 2017-05-31: 5 mL via ORAL
  Filled 2017-05-31: qty 5

## 2017-05-31 MED ORDER — METHYLPREDNISOLONE SODIUM SUCC 125 MG IJ SOLR
INTRAMUSCULAR | Status: AC
Start: 2017-05-31 — End: 2017-05-31
  Filled 2017-05-31: qty 2

## 2017-05-31 MED ORDER — LEVOFLOXACIN 750 MG PO TABS
750.0000 mg | ORAL_TABLET | Freq: Every day | ORAL | Status: DC
  Administered 2017-06-01 – 2017-06-02 (×2): 750 mg via ORAL
  Filled 2017-05-31 (×4): qty 1

## 2017-05-31 MED ORDER — CYCLOBENZAPRINE HCL 5 MG PO TABS
7.5000 mg | ORAL_TABLET | Freq: Three times a day (TID) | ORAL | Status: AC
  Administered 2017-05-31 – 2017-06-01 (×6): 7.5 mg via ORAL
  Filled 2017-05-31 (×6): qty 1.5

## 2017-05-31 MED ORDER — ALBUTEROL SULFATE (2.5 MG/3ML) 0.083% IN NEBU
7.5000 mg/h | INHALATION_SOLUTION | RESPIRATORY_TRACT | Status: AC
  Administered 2017-05-31: 7.5 mg/h via RESPIRATORY_TRACT

## 2017-05-31 MED ORDER — LUBIPROSTONE 24 MCG PO CAPS
24.0000 ug | ORAL_CAPSULE | Freq: Two times a day (BID) | ORAL | Status: DC
  Administered 2017-05-31 – 2017-06-02 (×5): 24 ug via ORAL
  Filled 2017-05-31 (×5): qty 1

## 2017-05-31 MED ORDER — PANTOPRAZOLE SODIUM 40 MG PO TBEC
40.0000 mg | DELAYED_RELEASE_TABLET | Freq: Two times a day (BID) | ORAL | Status: DC
  Administered 2017-05-31 – 2017-06-02 (×4): 40 mg via ORAL
  Filled 2017-05-31 (×4): qty 1

## 2017-05-31 MED ORDER — LURASIDONE HCL 40 MG PO TABS
40.0000 mg | ORAL_TABLET | Freq: Every day | ORAL | Status: DC
  Administered 2017-05-31 – 2017-06-02 (×3): 40 mg via ORAL
  Filled 2017-05-31 (×3): qty 1

## 2017-05-31 MED ORDER — BENZONATATE 100 MG PO CAPS
200.0000 mg | ORAL_CAPSULE | Freq: Three times a day (TID) | ORAL | Status: DC
  Administered 2017-05-31 – 2017-06-02 (×6): 200 mg via ORAL
  Filled 2017-05-31 (×6): qty 2

## 2017-05-31 MED ORDER — AZITHROMYCIN 500 MG PO TABS
500.0000 mg | ORAL_TABLET | Freq: Once | ORAL | Status: AC
  Administered 2017-05-31: 500 mg via ORAL
  Filled 2017-05-31: qty 1

## 2017-05-31 MED ORDER — METHYLPREDNISOLONE SODIUM SUCC 125 MG IJ SOLR
60.0000 mg | Freq: Four times a day (QID) | INTRAMUSCULAR | Status: DC
  Administered 2017-05-31 – 2017-06-02 (×9): 60 mg via INTRAVENOUS
  Filled 2017-05-31 (×9): qty 2

## 2017-05-31 MED ORDER — HYDROCOD POLST-CPM POLST ER 10-8 MG/5ML PO SUER
5.0000 mL | Freq: Two times a day (BID) | ORAL | Status: DC
  Administered 2017-05-31 – 2017-06-02 (×5): 5 mL via ORAL
  Filled 2017-05-31 (×5): qty 5

## 2017-05-31 MED ORDER — IPRATROPIUM-ALBUTEROL 0.5-2.5 (3) MG/3ML IN SOLN
3.0000 mL | RESPIRATORY_TRACT | Status: DC
  Administered 2017-05-31 – 2017-06-01 (×9): 3 mL via RESPIRATORY_TRACT
  Filled 2017-05-31 (×9): qty 3

## 2017-05-31 MED ORDER — PREDNISONE 20 MG PO TABS: 60.0000 mg | ORAL_TABLET | Freq: Every day | ORAL | 0 refills | Status: DC

## 2017-05-31 MED ORDER — COMPRESSOR/NEBULIZER MISC: 1.0000 | Freq: Once | 0 refills | Status: DC

## 2017-05-31 MED ORDER — TRAMADOL HCL 50 MG PO TABS
50.0000 mg | ORAL_TABLET | Freq: Four times a day (QID) | ORAL | Status: DC | PRN
  Administered 2017-05-31: 50 mg via ORAL
  Filled 2017-05-31: qty 1

## 2017-05-31 MED ORDER — SUCRALFATE 1 GM/10ML PO SUSP
1.0000 g | Freq: Three times a day (TID) | ORAL | Status: DC
  Administered 2017-05-31 – 2017-06-02 (×8): 1 g via ORAL
  Filled 2017-05-31 (×9): qty 10

## 2017-05-31 MED ORDER — ACETAMINOPHEN 650 MG RE SUPP: 650.0000 mg | Freq: Four times a day (QID) | RECTAL | Status: DC | PRN

## 2017-05-31 MED ORDER — MAGNESIUM SULFATE IN D5W 1-5 GM/100ML-% IV SOLN
1.0000 g | Freq: Once | INTRAVENOUS | Status: AC
  Administered 2017-05-31: 1 g via INTRAVENOUS
  Filled 2017-05-31: qty 100

## 2017-05-31 MED ORDER — NICOTINE 21 MG/24HR TD PT24
21.0000 mg | MEDICATED_PATCH | Freq: Every day | TRANSDERMAL | Status: DC
  Administered 2017-05-31 – 2017-06-02 (×3): 21 mg via TRANSDERMAL
  Filled 2017-05-31 (×3): qty 1

## 2017-05-31 MED ORDER — LORATADINE 10 MG PO TABS
10.0000 mg | ORAL_TABLET | Freq: Every day | ORAL | Status: DC
  Administered 2017-05-31 – 2017-06-02 (×3): 10 mg via ORAL
  Filled 2017-05-31 (×3): qty 1

## 2017-05-31 MED ORDER — AZITHROMYCIN 500 MG PO TABS: 500.0000 mg | ORAL_TABLET | Freq: Every day | ORAL | 0 refills | Status: DC

## 2017-05-31 MED ORDER — ALBUTEROL SULFATE (2.5 MG/3ML) 0.083% IN NEBU: 2.5000 mg | INHALATION_SOLUTION | Freq: Four times a day (QID) | RESPIRATORY_TRACT | 12 refills | Status: DC | PRN

## 2017-05-31 NOTE — Progress Notes (Signed)
ANTIBIOTIC CONSULT NOTE - INITIAL  Pharmacy Consult for Levaquin Indication: pneumonia  Allergies  Allergen Reactions  . Penicillins Hives    Patient Measurements: Weight: 136 lb (61.7 kg) Adjusted Body Weight:    Vital Signs: Temp: 98.5 F (36.9 C) (11/30 1027) Temp Source: Axillary (11/30 1027) BP: 137/75 (11/30 1027) Pulse Rate: 102 (11/30 1027) Intake/Output from previous day: No intake/output data recorded. Intake/Output from this shift: No intake/output data recorded.  Labs: Recent Labs    05/31/17 0529  WBC 10.2  HGB 12.5  PLT 277  CREATININE 0.72   Estimated Creatinine Clearance: 86.4 mL/min (by C-G formula based on SCr of 0.72 mg/dL). No results for input(s): VANCOTROUGH, VANCOPEAK, VANCORANDOM, GENTTROUGH, GENTPEAK, GENTRANDOM, TOBRATROUGH, TOBRAPEAK, TOBRARND, AMIKACINPEAK, AMIKACINTROU, AMIKACIN in the last 72 hours.   Microbiology: No results found for this or any previous visit (from the past 720 hour(s)).  Medical History: Past Medical History:  Diagnosis Date  . Depression   . Helicobacter pylori gastritis   . Irritable bowel syndrome (IBS)   . Tobacco use     Assessment: Patient is a 46yo female admitted for increasing cough and SOB. CXR suggests pneumonitis pattern. Pharmacy consulted for Levafloxacin dosing. Patient received Azithromycin 500mg  PO x one dose in the ED on 11/30.  Plan:  Will order Levaquin 750mg  PO daily, will delay start until 12/1 to avoid potential interaction with the dose of Azithromycin and potential for QT prolongation.  Clovia CuffLisa Trevar Boehringer, PharmD, BCPS 05/31/2017 11:51 AM

## 2017-05-31 NOTE — ED Notes (Signed)
Report to Jennifer, RN

## 2017-05-31 NOTE — ED Provider Notes (Signed)
-----------------------------------------   8:30 AM on 05/31/2017 -----------------------------------------  Patient reevaluated, she is currently resting comfortably with normal respiratory rate.  Minimal end expiratory wheezing but speaking in full sentences, otherwise clear lungs.  Alert oriented no distress.  She reports she feels much improved and would like to be able to go home.  She appears well for outpatient treatment, but before discharging her we will perform a trending pulse ox and ambulate her to make sure that she is feeling well and no signs of respiratory distress arise.  ----------------------------------------- 8:44 AM on 05/31/2017 -----------------------------------------  Patient ambulated after she is feeling improved, but during ambulation pulse oximetry 88% and she became more short of breath now with ongoing audible expiratory wheezing and reporting increasing shortness of breath with minimal tachypnea and mild accessory use ongoing.  At this point, discussed with patient and given her associated hypoxia with walking and ongoing symptoms after multiple treatments with atypical pneumonia will admit her to the hospital for further treatment and care.  Patient is agreeable and reports just feels extremely weak and tired, ongoing shortness of breath.  Not in extremis, but it is speaking in short phrases at this time with notable wheezing.  Patient will be admitted to the hospitalist service.  Patient agreeable with plan for admission.     Sharyn CreamerQuale, Nasire Reali, MD 05/31/17 [phone]

## 2017-05-31 NOTE — ED Triage Notes (Signed)
Patient c/o congestion, cough, dyspnea X 1 week with increasing severity upon wakening this morning. Patient is dyspneic, tachypneic, with audible wheezing during triage.

## 2017-05-31 NOTE — H&P (Signed)
[facility] [place] [facility]   PATIENT NAME: Kim Howard    MR#:  [id number]  DATE OF BIRTH:  1971/04/22  DATE OF ADMISSION:  05/31/2017  PRIMARY CARE PHYSICIAN: Fayrene HelperBoswell, Chelsa H, NP   REQUESTING/REFERRING PHYSICIAN: Dr. Sharyn CreamerMark Quale  CHIEF COMPLAINT:   Chief Complaint  Patient presents with  . Shortness of Breath    HISTORY OF PRESENT ILLNESS:  Kim Applawani Gilardi  is a 46 y.o. female with a known history of depression, ongoing smoking, [name] Patient states her boyfriend had cough and URI symptoms for a couple of weeks and then she felt like since last week she started having worsening cough and shortness of breath. Got much worse overnight that she had to come to the emergency room. Denies any prior history of asthma or COPD. Has been smoking 1 pack per day at least for several years now. Last night she couldn't catch her breath, was wheezing hoarsely and  received multiple DuoNeb treatments with not much improvement. Denies any fevers, but had chills at home. Chest x-ray with pneumonitis pattern noted. Patient was tachypneic and dyspneic on arrival. Much improved now. Still hypoxic with exertion. Sats down to 88% on room air on ambulation today. She is being admitted for pneumonitis and bronchospasm.  PAST MEDICAL HISTORY:   Past Medical History:  Diagnosis Date  . Depression   . Helicobacter pylori gastritis   . Irritable bowel syndrome (IBS)   . Tobacco use     PAST SURGICAL HISTORY:  History reviewed. No pertinent surgical history.  SOCIAL HISTORY:   Social History   Tobacco Use  . Smoking status: Current Every Day Smoker    Packs/day: 1.00    Types: Cigarettes  . Smokeless tobacco: Never Used  Substance Use Topics  . Alcohol use: No    FAMILY HISTORY:   Family History  Problem Relation Age of Onset  . Prostate cancer Father     DRUG ALLERGIES:    Allergies  Allergen Reactions  . Penicillins Hives    REVIEW OF SYSTEMS:   Review of Systems  Constitutional: Positive for chills and malaise/fatigue. Negative for fever and weight loss.  HENT: Negative for ear discharge, ear pain, hearing loss and nosebleeds.   Eyes: Negative for blurred vision, double vision and photophobia.  Respiratory: Positive for cough, shortness of breath and wheezing. Negative for hemoptysis.   Cardiovascular: Positive for chest pain. Negative for palpitations, orthopnea and leg swelling.  Gastrointestinal: Positive for heartburn. Negative for abdominal pain, constipation, diarrhea, melena, nausea and vomiting.  Genitourinary: Negative for dysuria, frequency and urgency.  Musculoskeletal: Positive for back pain and myalgias. Negative for neck pain.  Skin: Negative for rash.  Neurological: Negative for dizziness, tingling, sensory change, speech change, focal weakness and headaches.  Endo/Heme/Allergies: Does not bruise/bleed easily.  Psychiatric/Behavioral: Negative for depression.    MEDICATIONS AT HOME:   Prior to Admission medications   Medication Sig Start Date End Date Taking? Authorizing Provider  doxycycline (VIBRA-TABS) 100 MG tablet Take 1 tablet (100 mg total) by mouth 2 (two) times daily. 01/13/17  Yes Rebecka ApleyWebster, Allison P, MD  lubiprostone (AMITIZA) 24 MCG capsule Take 24 mcg by mouth every 12 (twelve) hours.   Yes [provider]  lurasidone (LATUDA) 40 MG TABS tablet Take 40 mg by mouth daily.   Yes [provider]  albuterol (PROVENTIL) (2.5 MG/3ML) 0.083% nebulizer solution Take 3 mLs (2.5  mg total) by nebulization every 6 (six) hours as needed for wheezing or shortness of breath. 05/31/17   Darci CurrentBrown, Austell N, MD  azithromycin (ZITHROMAX) 500 MG tablet Take 1 tablet (500 mg total) by mouth daily for 3 days. 05/31/17 06/03/17  Darci CurrentBrown, [place] N, MD  etodolac (LODINE) 200 MG capsule Take 1 capsule (200 mg total) by mouth every 8  (eight) hours. Patient not taking: Reported on 05/31/2017 01/13/17   Rebecka ApleyWebster, Allison P, MD  HYDROcodone-acetaminophen (NORCO/VICODIN) 5-325 MG tablet Take 1 tablet by mouth every 4 (four) hours as needed for moderate pain. Patient not taking: Reported on 05/31/2017 10/26/16 10/26/17  Kem Boroughsriplett, Cari B, FNP  meloxicam (MOBIC) 15 MG tablet Take 1 tablet (15 mg total) by mouth daily. Patient not taking: Reported on 05/31/2017 10/26/16   Kem Boroughsriplett, Cari B, FNP  methocarbamol (ROBAXIN) 500 MG tablet Take 1 tablet (500 mg total) by mouth 4 (four) times daily. Patient not taking: Reported on 05/31/2017 10/15/16   Cuthriell, Delorise RoyalsJonathan D, PA-C  Nebulizers (COMPRESSOR/NEBULIZER) MISC 1 Device by Does not apply route once for 1 dose. 05/31/17 05/31/17  Darci CurrentBrown, [place] N, MD  ondansetron (ZOFRAN ODT) 8 MG disintegrating tablet Take 1 tablet (8 mg total) by mouth every 8 (eight) hours as needed for nausea or vomiting. 05/25/15   Sharman CheekStafford, Phillip, MD  predniSONE (DELTASONE) 20 MG tablet Take 3 tablets (60 mg total) by mouth daily for 5 days. 05/31/17 06/05/17  Darci CurrentBrown, [place] N, MD  ranitidine (ZANTAC) 150 MG capsule Take 1 capsule (150 mg total) by mouth 2 (two) times daily. 05/25/15   Sharman CheekStafford, Phillip, MD  traMADol (ULTRAM) 50 MG tablet Take 1 tablet (50 mg total) by mouth every 6 (six) hours as needed. Patient not taking: Reported on 05/31/2017 01/13/17   Rebecka ApleyWebster, Allison P, MD      VITAL SIGNS:  Blood pressure 137/75, pulse (!) 102, temperature 98.5 F (36.9 C), temperature source Axillary, resp. rate 17, weight 61.7 kg (136 lb), SpO2 98 %.  PHYSICAL EXAMINATION:   Physical Exam  GENERAL:  46 y.o.-year-old patient lying in the bed with no acute distress.  EYES: Pupils equal, round, reactive to light and accommodation. No scleral icterus. Extraocular muscles intact.  HEENT: Head atraumatic, normocephalic. Oropharynx and nasopharynx clear.  NECK:  Supple, no jugular venous distention. No thyroid  enlargement, no tenderness.  LUNGS: Diffuse wheezing on exam bilaterally, no rales,rhonchi or crepitation. No use of accessory muscles of respiration.  CARDIOVASCULAR: S1, S2 normal. No murmurs, rubs, or gallops.  ABDOMEN: Soft, nontender, nondistended. Bowel sounds present. No organomegaly or mass.  EXTREMITIES: No pedal edema, cyanosis, or clubbing.  NEUROLOGIC: Cranial nerves II through XII are intact. Muscle strength 5/5 in all extremities. Sensation intact. Gait not checked.  PSYCHIATRIC: The patient is alert and oriented x 3.  SKIN: No obvious rash, lesion, or ulcer.   LABORATORY PANEL:   CBC Recent Labs  Lab 05/31/17 0529  WBC 10.2  HGB 12.5  HCT 37.2  PLT 277   ------------------------------------------------------------------------------------------------------------------  Chemistries  Recent Labs  Lab 05/31/17 0529  NA 138  K 3.5  CL 107  CO2 20*  GLUCOSE 101*  BUN 11  CREATININE 0.72  CALCIUM 9.0   ------------------------------------------------------------------------------------------------------------------  Cardiac Enzymes Recent Labs  Lab 05/31/17 0529  TROPONINI <0.03   ------------------------------------------------------------------------------------------------------------------  RADIOLOGY:  Dg Chest Portable 1 View  Result Date: 05/31/2017 CLINICAL DATA:  Cough, congestion, dyspnea for 1 week, worsening today. EXAM: PORTABLE CHEST 1 VIEW COMPARISON:  Thoracic spine radiograph October 15, 2016 FINDINGS: The cardiac silhouette is mildly enlarged. Mediastinal silhouette is unremarkable. Interstitial prominence without pleural effusion or focal consolidation. Normal lung volumes. No pneumothorax. Soft tissue planes included osseous structures are nonsuspicious. IMPRESSION: Mild cardiomegaly. Interstitial prominence seen with atypical infection or pulmonary edema. No focal consolidation. Electronically Signed   By: Awilda Metro M.D.   On:  05/31/2017 05:55    EKG:   Orders placed or performed during the hospital encounter of 05/31/17  . ED EKG within 10 minutes  . ED EKG within 10 minutes  . EKG 12-Lead  . EKG 12-Lead  . EKG 12-Lead  . EKG 12-Lead    IMPRESSION AND PLAN:   Mozelle Remlinger  is a 46 y.o. female with a known history of depression, ongoing smoking, [name]  #1 acute pneumonitis with bronchospasm-recently finished doxycycline, started on Levaquin -Continue IV Solu-Medrol, duo nebs -Cough medications and muscle relaxants added. -When necessary oxygen for now and wean as tolerated.  #2 tobacco use disorder-strongly counseled against smoking. Started on nicotine patch.  #3 H pylori gastritis-recently treated with bismuth, Flagyl and doxycycline as outpatient. Continue Protonix twice a day and sucralfate as recommended by GI. Scheduled for EGD on December 6 as outpatient.  #4 DVT prophylaxis heparin on Lovenox  #5 Depression- continue home meds   All the records are reviewed and case discussed with ED provider. Management plans discussed with the patient, family and they are in agreement.  CODE STATUS: Full code  TOTAL TIME TAKING CARE OF THIS PATIENT: 50 minutes.    Enid Baas M.D on 05/31/2017 at 10:45 AM  Between 7am to 6pm - Pager - [phone]  After 6pm go to [email or web address] - Social research officer, government  [facility]  Office  [phone]  CC: Primary care physician; Fayrene Helper, NP

## 2017-05-31 NOTE — ED Notes (Signed)
ED Provider at bedside. 

## 2017-05-31 NOTE — ED Provider Notes (Signed)
[facility] Emergency Department Provider Note   First MD Initiated Contact with Patient 05/31/17 0522     (approximate)  I have reviewed the triage vital signs and the nursing notes.   HISTORY  Chief Complaint Shortness of Breath    HPI Kim Howard is a 46 y.o. female with below list of chronic medical conditions presents with one-week history of productive cough congestion and dyspnea.  Patient denies any fever afebrile on presentation with temperature 98.1.  Patient does admit to approximately 2 pack a day cigarette use.   Past Medical History:  Diagnosis Date  . Depression   . Irritable bowel syndrome (IBS)     There are no active problems to display for this patient.   History reviewed. No pertinent surgical history.  Prior to Admission medications   Medication Sig Start Date End Date Taking? Authorizing Provider  doxycycline (VIBRA-TABS) 100 MG tablet Take 1 tablet (100 mg total) by mouth 2 (two) times daily. 01/13/17   Rebecka ApleyWebster, Allison P, MD  etodolac (LODINE) 200 MG capsule Take 1 capsule (200 mg total) by mouth every 8 (eight) hours. 01/13/17   Rebecka ApleyWebster, Allison P, MD  HYDROcodone-acetaminophen (NORCO/VICODIN) 5-325 MG tablet Take 1 tablet by mouth every 4 (four) hours as needed for moderate pain. 10/26/16 10/26/17  Triplett, Kasandra Knudsenari B, FNP  lubiprostone (AMITIZA) 24 MCG capsule Take 24 mcg by mouth every 12 (twelve) hours.    [provider]  lurasidone (LATUDA) 40 MG TABS tablet Take 40 mg by mouth daily.    [provider]  meloxicam (MOBIC) 15 MG tablet Take 1 tablet (15 mg total) by mouth daily. 10/26/16   Triplett, Rulon Eisenmengerari B, FNP  methocarbamol (ROBAXIN) 500 MG tablet Take 1 tablet (500 mg total) by mouth 4 (four) times daily. 10/15/16   Cuthriell, Delorise RoyalsJonathan D, PA-C  ondansetron (ZOFRAN ODT) 8 MG disintegrating tablet Take 1 tablet (8 mg total) by mouth every 8 (eight) hours as needed for nausea or vomiting. 05/25/15   Sharman CheekStafford,  Phillip, MD  QUEtiapine Fumarate (SEROQUEL XR) 150 MG 24 hr tablet Take 150 mg by mouth at bedtime.    [provider]  ranitidine (ZANTAC) 150 MG capsule Take 1 capsule (150 mg total) by mouth 2 (two) times daily. 05/25/15   Sharman CheekStafford, Phillip, MD  traMADol (ULTRAM) 50 MG tablet Take 1 tablet (50 mg total) by mouth every 6 (six) hours as needed. 01/13/17   Rebecka ApleyWebster, Allison P, MD    Allergies Penicillins  No family history on file.  Social History Social History   Tobacco Use  . Smoking status: Current Every Day Smoker    Packs/day: 1.00    Types: Cigarettes  . Smokeless tobacco: Never Used  Substance Use Topics  . Alcohol use: No  . Drug use: No    Review of Systems Constitutional: No fever/chills Eyes: No visual changes. ENT: No sore throat. Cardiovascular: Denies  chest pain. Respiratory: Positive for cough, dyspnea Gastrointestinal: No abdominal pain.  No nausea, no vomiting.  No diarrhea.  No constipation. Genitourinary: Negative for dysuria. Musculoskeletal: Negative for neck pain.  Negative for back pain. Integumentary: Negative for rash. Neurological: Negative for headaches, focal weakness or numbness.   ____________________________________________   PHYSICAL EXAM:  VITAL SIGNS: ED Triage Vitals  Enc Vitals Group     BP 05/31/17 0527 (!) 151/104     Pulse Rate 05/31/17 0527 76     Resp 05/31/17 0527 (!) 28     Temp 05/31/17  0527 98.1 F (36.7 C)     Temp Source 05/31/17 0527 Axillary     SpO2 05/31/17 0527 100 %     Weight 05/31/17 0525 61.7 kg (136 lb)     Height --      Head Circumference --      Peak Flow --      Pain Score 05/31/17 0524 6     Pain Loc --      Pain Edu? --      Excl. in GC? --     Constitutional: Alert and oriented.  Apparent respiratory difficulty Eyes: Conjunctivae are normal.  Head: Atraumatic. Mouth/Throat: Mucous membranes are moist. Neck: No stridor.  No meningeal signs.   Cardiovascular: Normal rate, regular  rhythm. Good peripheral circulation. Grossly normal heart sounds. Respiratory: Tachypnea, positive accessory respiratory muscle use coarse wheezes throughout Gastrointestinal: Soft and nontender. No distention.  Musculoskeletal: No lower extremity tenderness nor edema. No gross deformities of extremities. Neurologic:  Normal speech and language. No gross focal neurologic deficits are appreciated.  Skin:  Skin is warm, dry and intact. No rash noted. Psychiatric: Mood and affect are normal. Speech and behavior are normal.  ____________________________________________   LABS (all labs ordered are listed, but only abnormal results are displayed)  Labs Reviewed  BASIC METABOLIC PANEL - Abnormal; Notable for the following components:      Result Value   CO2 20 (*)    Glucose, Bld 101 (*)    All other components within normal limits  CBC  TROPONIN I  POC URINE PREG, ED   ____________________________________________  EKG  ED ECG REPORT I, [place] N Peggy Monk, the attending physician, personally viewed and interpreted this ECG.   Date: 05/31/2017  EKG Time: 5:30 AM  Rate: 81  Rhythm: Normal sinus rhythm  Axis: Normal  intervals: Normal  ST&T Change:   ____________________________________________  RADIOLOGY I, [place] Dewayne ShorterN Melissa Pulido, personally viewed and evaluated these images (plain radiographs) as part of my medical decision making, as well as reviewing the written report by the radiologist.  Dg Chest Portable 1 View  Result Date: 05/31/2017 CLINICAL DATA:  Cough, congestion, dyspnea for 1 week, worsening today. EXAM: PORTABLE CHEST 1 VIEW COMPARISON:  Thoracic spine radiograph October 15, 2016 FINDINGS: The cardiac silhouette is mildly enlarged. Mediastinal silhouette is unremarkable. Interstitial prominence without pleural effusion or focal consolidation. Normal lung volumes. No pneumothorax. Soft tissue planes included osseous structures are nonsuspicious. IMPRESSION: Mild  cardiomegaly. Interstitial prominence seen with atypical infection or pulmonary edema. No focal consolidation. Electronically Signed   By: Awilda Metroourtnay  Bloomer M.D.   On: 05/31/2017 05:55    ____________________________________________   PROCEDURES  Critical Care performed: CRITICAL CARE Performed by: Darci CurrentANDOLPH N Neysha Criado   Total critical care time: 45 minutes  Critical care time was exclusive of separately billable procedures and treating other patients.  Critical care was necessary to treat or prevent imminent or life-threatening deterioration.  Critical care was time spent personally by me on the following activities: development of treatment plan with patient and/or surrogate as well as nursing, discussions with consultants, evaluation of patient's response to treatment, examination of patient, obtaining history from patient or surrogate, ordering and performing treatments and interventions, ordering and review of laboratory studies, ordering and review of radiographic studies, pulse oximetry and re-evaluation of patient's condition.   Procedures   ____________________________________________   INITIAL IMPRESSION / ASSESSMENT AND PLAN / ED COURSE  As part of my medical decision making, I reviewed the following data within the  electronic MEDICAL RECORD NUMBER8 year old female present with above-stated history and physical exam respiratory distress.  Patient given multiple duo nebs in the emergency department as well as IV Solu-Medrol and azithromycin secondary to concern for atypical pneumonia based on chest x-ray findings.  Patient's clinical status much improved at this time.  Patient's care transferred to Dr. Fanny Bien ____________________________________________  FINAL CLINICAL IMPRESSION(S) / ED DIAGNOSES  Final diagnoses:  Community acquired pneumonia, unspecified laterality  Bronchospasm with bronchitis, acute  Atypical pneumonia     MEDICATIONS GIVEN DURING THIS  VISIT:  Medications  magnesium sulfate IVPB 1 g 100 mL (1 g Intravenous New Bag/Given 05/31/17 0621)  ipratropium-albuterol (DUONEB) 0.5-2.5 (3) MG/3ML nebulizer solution 3 mL (3 mLs Nebulization Given 05/31/17 0523)  ipratropium-albuterol (DUONEB) 0.5-2.5 (3) MG/3ML nebulizer solution 3 mL (3 mLs Nebulization Given 05/31/17 0537)  ipratropium-albuterol (DUONEB) 0.5-2.5 (3) MG/3ML nebulizer solution 3 mL (3 mLs Nebulization Given 05/31/17 0537)  methylPREDNISolone sodium succinate (SOLU-MEDROL) 125 mg/2 mL injection 125 mg ( Intravenous Given 05/31/17 0545)  azithromycin (ZITHROMAX) tablet 500 mg (500 mg Oral Given 05/31/17 0613)  chlorpheniramine-HYDROcodone (TUSSIONEX) 10-8 MG/5ML suspension 5 mL (5 mLs Oral Given 05/31/17 8295)     ED Discharge Orders    None       Note:  This document was prepared using Dragon voice recognition software and may include unintentional dictation errors.    Darci Current, MD 06/01/17 [phone]

## 2017-05-31 NOTE — ED Notes (Signed)
Attempt to call report unsuccessful.

## 2017-06-01 LAB — CBC
HCT: 36.6 % (ref 35.0–47.0)
HEMOGLOBIN: 12.1 g/dL (ref 12.0–16.0)
MCH: 31.8 pg (ref 26.0–34.0)
MCHC: 33.2 g/dL (ref 32.0–36.0)
MCV: 95.8 fL (ref 80.0–100.0)
Platelets: 281 10*3/uL (ref 150–440)
RBC: 3.82 MIL/uL (ref 3.80–5.20)
RDW: 12.9 % (ref 11.5–14.5)
WBC: 17.4 10*3/uL — AB (ref 3.6–11.0)

## 2017-06-01 LAB — BASIC METABOLIC PANEL
ANION GAP: 10 (ref 5–15)
BUN: 10 mg/dL (ref 6–20)
CALCIUM: 9.3 mg/dL (ref 8.9–10.3)
CO2: 21 mmol/L — ABNORMAL LOW (ref 22–32)
Chloride: 105 mmol/L (ref 101–111)
Creatinine, Ser: 0.73 mg/dL (ref 0.44–1.00)
GLUCOSE: 159 mg/dL — AB (ref 65–99)
Potassium: 3.3 mmol/L — ABNORMAL LOW (ref 3.5–5.1)
SODIUM: 136 mmol/L (ref 135–145)

## 2017-06-01 LAB — HIV ANTIBODY (ROUTINE TESTING W REFLEX): HIV SCREEN 4TH GENERATION: NONREACTIVE

## 2017-06-01 MED ORDER — POTASSIUM CHLORIDE CRYS ER 20 MEQ PO TBCR: 40.0000 meq | EXTENDED_RELEASE_TABLET | ORAL | Status: DC

## 2017-06-01 MED ORDER — ALBUTEROL SULFATE (2.5 MG/3ML) 0.083% IN NEBU
2.5000 mg | INHALATION_SOLUTION | RESPIRATORY_TRACT | Status: DC | PRN
  Administered 2017-06-01: 2.5 mg via RESPIRATORY_TRACT
  Filled 2017-06-01: qty 3

## 2017-06-01 MED ORDER — GUAIFENESIN-DM 100-10 MG/5ML PO SYRP
5.0000 mL | ORAL_SOLUTION | ORAL | Status: DC | PRN
  Administered 2017-06-01 (×2): 5 mL via ORAL
  Filled 2017-06-01 (×2): qty 5

## 2017-06-01 MED ORDER — POTASSIUM CHLORIDE CRYS ER 20 MEQ PO TBCR
40.0000 meq | EXTENDED_RELEASE_TABLET | Freq: Once | ORAL | Status: AC
  Administered 2017-06-01: 40 meq via ORAL
  Filled 2017-06-01: qty 2

## 2017-06-01 MED ORDER — ALBUTEROL SULFATE (2.5 MG/3ML) 0.083% IN NEBU
INHALATION_SOLUTION | RESPIRATORY_TRACT | Status: AC
  Administered 2017-06-01: 2.5 mg
  Filled 2017-06-01: qty 3

## 2017-06-01 MED ORDER — IPRATROPIUM-ALBUTEROL 0.5-2.5 (3) MG/3ML IN SOLN
3.0000 mL | Freq: Four times a day (QID) | RESPIRATORY_TRACT | Status: DC
  Administered 2017-06-02: 3 mL via RESPIRATORY_TRACT
  Filled 2017-06-01 (×2): qty 3

## 2017-06-01 MED ORDER — FUROSEMIDE 10 MG/ML IJ SOLN
40.0000 mg | INTRAMUSCULAR | Status: AC
  Administered 2017-06-01: 40 mg via INTRAVENOUS
  Filled 2017-06-01: qty 4

## 2017-06-01 MED ORDER — LORAZEPAM 2 MG/ML IJ SOLN
2.0000 mg | Freq: Once | INTRAMUSCULAR | Status: AC
  Administered 2017-06-01: 2 mg via INTRAVENOUS
  Filled 2017-06-01: qty 1

## 2017-06-01 MED ORDER — TRAMADOL HCL 50 MG PO TABS
50.0000 mg | ORAL_TABLET | Freq: Two times a day (BID) | ORAL | Status: DC
  Administered 2017-06-01 – 2017-06-02 (×2): 50 mg via ORAL
  Filled 2017-06-01 (×2): qty 1

## 2017-06-01 MED ORDER — BUDESONIDE 0.5 MG/2ML IN SUSP
0.5000 mg | Freq: Two times a day (BID) | RESPIRATORY_TRACT | Status: DC
  Administered 2017-06-01 – 2017-06-02 (×2): 0.5 mg via RESPIRATORY_TRACT
  Filled 2017-06-01 (×2): qty 2

## 2017-06-01 NOTE — Progress Notes (Signed)
[facility] [place] [facility]   PATIENT NAME: Kim Howard    MR#:  [id number]  DATE OF BIRTH:  1971/01/10  SUBJECTIVE:  CHIEF COMPLAINT:   Chief Complaint  Patient presents with  . Shortness of Breath   - Significant coughing spells today and also wheezing. Started last night - still feels bad  REVIEW OF SYSTEMS:  Review of Systems  Constitutional: Negative for chills, fever and malaise/fatigue.  HENT: Negative for congestion, ear discharge, hearing loss and nosebleeds.   Eyes: Negative for blurred vision and double vision.  Respiratory: Positive for cough and shortness of breath. Negative for wheezing.   Cardiovascular: Negative for chest pain and palpitations.  Gastrointestinal: Negative for abdominal pain, constipation, diarrhea, nausea and vomiting.  Genitourinary: Negative for dysuria.  Musculoskeletal: Negative for myalgias.  Neurological: Negative for dizziness, speech change, focal weakness, seizures and headaches.  Psychiatric/Behavioral: Negative for depression.    DRUG ALLERGIES:   Allergies  Allergen Reactions  . Penicillins Hives    VITALS:  Blood pressure 134/73, pulse (!) 101, temperature 98.6 F (37 C), temperature source Oral, resp. rate 20, height 5\' 7"  (1.702 m), weight 62 kg (136 lb 11.2 oz), SpO2 98 %.  PHYSICAL EXAMINATION:  Physical Exam  GENERAL:  46 y.o.-year-old patient sitting in the bed, appears to be in distress secondary to cough.  EYES: Pupils equal, round, reactive to light and accommodation. No scleral icterus. Extraocular muscles intact.  HEENT: Head atraumatic, normocephalic. Oropharynx and nasopharynx clear.  NECK:  Supple, no jugular venous distention. No thyroid enlargement, no tenderness.  LUNGS: Diffuse wheezing on exam bilaterally, no rales,rhonchi or crepitation. No use of accessory muscles of respiration CARDIOVASCULAR: S1, S2 normal. No rubs, or gallops. No murmurs ABDOMEN: Soft, nontender,  nondistended. Bowel sounds present. No organomegaly or mass.  EXTREMITIES: No pedal edema, cyanosis, or clubbing.  NEUROLOGIC: Cranial nerves II through XII are intact. Muscle strength 5/5 in all extremities. Sensation intact. Gait not checked.  PSYCHIATRIC: The patient is alert and oriented x 3.  SKIN: No obvious rash, lesion, or ulcer.    LABORATORY PANEL:   CBC Recent Labs  Lab 06/01/17 0412  WBC 17.4*  HGB 12.1  HCT 36.6  PLT 281   ------------------------------------------------------------------------------------------------------------------  Chemistries  Recent Labs  Lab 06/01/17 0412  NA 136  K 3.3*  CL 105  CO2 21*  GLUCOSE 159*  BUN 10  CREATININE 0.73  CALCIUM 9.3   ------------------------------------------------------------------------------------------------------------------  Cardiac Enzymes Recent Labs  Lab 05/31/17 0529  TROPONINI <0.03   ------------------------------------------------------------------------------------------------------------------  RADIOLOGY:  Dg Chest Portable 1 View  Result Date: 05/31/2017 CLINICAL DATA:  Cough, congestion, dyspnea for 1 week, worsening today. EXAM: PORTABLE CHEST 1 VIEW COMPARISON:  Thoracic spine radiograph October 15, 2016 FINDINGS: The cardiac silhouette is mildly enlarged. Mediastinal silhouette is unremarkable. Interstitial prominence without pleural effusion or focal consolidation. Normal lung volumes. No pneumothorax. Soft tissue planes included osseous structures are nonsuspicious. IMPRESSION: Mild cardiomegaly. Interstitial prominence seen with atypical infection or pulmonary edema. No focal consolidation. Electronically Signed   By: Awilda Metroourtnay  Bloomer M.D.   On: 05/31/2017 05:55    EKG:   Orders placed or performed during the hospital encounter of 05/31/17  . ED EKG within 10 minutes  . ED EKG within 10 minutes  . EKG 12-Lead  . EKG 12-Lead  . EKG 12-Lead  . EKG 12-Lead    ASSESSMENT AND  PLAN:   Kim Applawani Freda  is a 46 y.o. female with a  known history of depression, ongoing smoking, [name]  #1 acute pneumonitis with bronchospasm-recently finished doxycycline,  -on Levaquin now -Continue IV Solu-Medrol, duo nebs- severe coughing spells today- cough meds - pulmicort nebs too - pulmonary consult -Cough medications and muscle relaxants added.  #2 tobacco use disorder-strongly counseled against smoking. on nicotine patch.  #3 H pylori gastritis-recently treated with bismuth, Flagyl and doxycycline as outpatient. Continue Protonix twice a day and sucralfate as recommended by GI. Scheduled for EGD on December 6 as outpatient.  #4 DVT prophylaxis - on Lovenox  #5 Depression- continue home meds       All the records are reviewed and case discussed with Care Management/Social Workerr. Management plans discussed with the patient, family and they are in agreement.  CODE STATUS: Full Code  TOTAL TIME TAKING CARE OF THIS PATIENT: 38 minutes.   POSSIBLE D/C IN 2 DAYS, DEPENDING ON CLINICAL CONDITION.   Enid BaasKALISETTI,Isaid Salvia M.D on 06/01/2017 at 12:06 PM  Between 7am to 6pm - Pager - [phone]  After 6pm go to [email or web address] - Social research officer, governmentpassword EPAS [email or web address]  [facility]  Office  [phone]  CC: Primary care physician; Fayrene HelperBoswell, Chelsa H, NP

## 2017-06-02 LAB — BASIC METABOLIC PANEL
ANION GAP: 8 (ref 5–15)
BUN: 14 mg/dL (ref 6–20)
CO2: 25 mmol/L (ref 22–32)
Calcium: 9.5 mg/dL (ref 8.9–10.3)
Chloride: 102 mmol/L (ref 101–111)
Creatinine, Ser: 0.59 mg/dL (ref 0.44–1.00)
GLUCOSE: 136 mg/dL — AB (ref 65–99)
POTASSIUM: 4.3 mmol/L (ref 3.5–5.1)
Sodium: 135 mmol/L (ref 135–145)

## 2017-06-02 MED ORDER — PREDNISONE 10 MG (21) PO TBPK: ORAL_TABLET | ORAL | 0 refills | Status: DC

## 2017-06-02 MED ORDER — TRAMADOL HCL 50 MG PO TABS: 50.0000 mg | ORAL_TABLET | Freq: Four times a day (QID) | ORAL | 0 refills | Status: DC | PRN

## 2017-06-02 MED ORDER — HYDROCOD POLST-CPM POLST ER 10-8 MG/5ML PO SUER: 5.0000 mL | Freq: Two times a day (BID) | ORAL | 0 refills | Status: DC

## 2017-06-02 MED ORDER — BENZONATATE 200 MG PO CAPS: 200.0000 mg | ORAL_CAPSULE | Freq: Three times a day (TID) | ORAL | 0 refills | Status: DC

## 2017-06-02 MED ORDER — ALBUTEROL SULFATE (2.5 MG/3ML) 0.083% IN NEBU: 2.5000 mg | INHALATION_SOLUTION | Freq: Four times a day (QID) | RESPIRATORY_TRACT | 12 refills | Status: DC | PRN

## 2017-06-02 MED ORDER — PANTOPRAZOLE SODIUM 40 MG PO TBEC: 40.0000 mg | DELAYED_RELEASE_TABLET | Freq: Two times a day (BID) | ORAL | 2 refills | Status: DC

## 2017-06-02 MED ORDER — LEVOFLOXACIN 750 MG PO TABS: 750.0000 mg | ORAL_TABLET | Freq: Every day | ORAL | 0 refills | Status: AC

## 2017-06-02 MED ORDER — SUCRALFATE 1 GM/10ML PO SUSP: 1.0000 g | Freq: Three times a day (TID) | ORAL | 0 refills | Status: DC

## 2017-06-02 MED ORDER — COMPRESSOR/NEBULIZER MISC: 1.0000 | Freq: Once | 0 refills | Status: AC

## 2017-06-02 MED ORDER — NICOTINE 14 MG/24HR TD PT24: 14.0000 mg | MEDICATED_PATCH | TRANSDERMAL | 0 refills | Status: AC

## 2017-06-02 NOTE — Progress Notes (Signed)
Patient alert and oriented. Lung sounds clear on auscultation. Denies pain.Patient states that her cough has gone away. Vital signs stable. RN will continue to monitor.   Harvie HeckMelanie Kato Wieczorek, RN

## 2017-06-02 NOTE — Discharge Summary (Signed)
[facility] [place] [facility]   PATIENT NAME: Kim Howard    MR#:  [id number]  DATE OF BIRTH:  09/08/1970  DATE OF ADMISSION:  05/31/2017   ADMITTING PHYSICIAN: Kim Baasadhika Adolphe Fortunato, MD  DATE OF DISCHARGE: 06/02/17  PRIMARY CARE PHYSICIAN: Kim Howard, Kim H, NP   ADMISSION DIAGNOSIS:   Atypical pneumonia [J18.9] Bronchospasm with bronchitis, acute [J20.9] Community acquired pneumonia, unspecified laterality [J18.9]  DISCHARGE DIAGNOSIS:   Active Problems:   Pneumonitis   SECONDARY DIAGNOSIS:   Past Medical History:  Diagnosis Date  . Depression   . Helicobacter pylori gastritis   . Irritable bowel syndrome (IBS)   . Tobacco use     HOSPITAL COURSE:   [name], ongoing smoking, [name]  #1 acute pneumonitis with bronchospasm-recently finished doxycycline for H. Pylori trt  -on Levaquin now- continue at discharge as well -received IV Solu-Medrol, duo nebs- severe coughing spells - Much improved symptoms. Continue cough medications, nebulizer prescription and DuoNeb prescription given. Also prednisone taper at discharge. -Recommended outpatient pulmonary follow-up -Feels much better today. No further coughing spells. Once he gets discharged..  #2 tobacco use disorder-strongly counseled against smoking. on nicotine patch. Once a prescription for nicotine patch at discharge.  #3 H pylori gastritis-recently treated with bismuth, Flagyl and doxycycline as outpatient. Continue Protonix twice a day and sucralfate as recommended by GI. Scheduledfor EGD on December 6 as outpatient.  #4 depression-continue home medications    DISCHARGE CONDITIONS:   Guarded  CONSULTS OBTAINED:   Treatment Team:  Kim Howard, Kim A, MD  DRUG ALLERGIES:   Allergies  Allergen Reactions  . Penicillins Hives    DISCHARGE MEDICATIONS:   Allergies as of 06/02/2017      Reactions   Penicillins Hives      Medication List    STOP taking these medications   doxycycline 100 MG tablet Commonly known as:  VIBRA-TABS   etodolac 200 MG capsule Commonly known as:  LODINE   HYDROcodone-acetaminophen 5-325 MG tablet Commonly known as:  NORCO/VICODIN   meloxicam 15 MG tablet Commonly known as:  MOBIC   methocarbamol 500 MG tablet Commonly known as:  ROBAXIN     TAKE these medications   albuterol (2.5 MG/3ML) 0.083% nebulizer solution Commonly known as:  PROVENTIL Take 3 mLs (2.5 mg total) by nebulization every 6 (six) hours as needed for wheezing or shortness of breath.   benzonatate 200 MG capsule Commonly known as:  TESSALON Take 1 capsule (200 mg total) by mouth 3 (three) times daily.   chlorpheniramine-HYDROcodone 10-8 MG/5ML Suer Commonly known as:  TUSSIONEX Take 5 mLs by mouth every 12 (twelve) hours.   Compressor/Nebulizer Misc 1 Device by Does not apply route once for 1 dose.   levofloxacin 750 MG tablet Commonly known as:  LEVAQUIN Take 1 tablet (750 mg total) by mouth daily for 5 days. X 5 days Start taking on:  06/03/2017   lubiprostone 24 MCG capsule Commonly known as:  AMITIZA Take 24 mcg by mouth every 12 (twelve) hours.   lurasidone 40 MG Tabs tablet Commonly known as:  LATUDA Take 40 mg by mouth daily.   nicotine 14 mg/24hr patch Commonly known as:  NICODERM CQ - dosed in mg/24 hours Place 1 patch (14 mg total) onto the skin daily.   ondansetron 8 MG disintegrating tablet Commonly known as:  ZOFRAN ODT Take 1 tablet (8  mg total) by mouth every 8 (eight) hours as needed for nausea or vomiting.   pantoprazole 40 MG tablet Commonly known as:  PROTONIX Take 1 tablet (40 mg total) by mouth 2 (two) times daily before a meal.   predniSONE 10 MG (21) Tbpk tablet Commonly known as:  STERAPRED UNI-PAK 21 TAB 6 tabs PO x 1 day 5 tabs PO x 1 day 4 tabs PO x 1  day 3 tabs PO x 1 day 2 tabs PO x 1 day 1 tab PO x 1 day and stop   ranitidine 150 MG capsule Commonly known as:  ZANTAC Take 1 capsule (150 mg total) by mouth 2 (two) times daily. Notes to patient:  Not taken during this admission   sucralfate 1 GM/10ML suspension Commonly known as:  CARAFATE Take 10 mLs (1 g total) by mouth 4 (four) times daily -  with meals and at bedtime.   traMADol 50 MG tablet Commonly known as:  ULTRAM Take 1 tablet (50 mg total) by mouth every 6 (six) hours as needed.        DISCHARGE INSTRUCTIONS:   1. PCP will follow-up in 1-2 weeks 2. Pulmonary outpatient follow-up  DIET:   Cardiac diet  ACTIVITY:   Activity as tolerated  OXYGEN:   Home Oxygen: No.  Oxygen Delivery: room air  DISCHARGE LOCATION:   home   If you experience worsening of your admission symptoms, develop shortness of breath, life threatening emergency, suicidal or homicidal thoughts you must seek medical attention immediately by calling 911 or calling your MD immediately  if symptoms less severe.  You Must read complete instructions/literature along with all the possible adverse reactions/side effects for all the Medicines you take and that have been prescribed to you. Take any new Medicines after you have completely understood and accpet all the possible adverse reactions/side effects.   Please note  You were cared for by a hospitalist during your hospital stay. If you have any questions about your discharge medications or the care you received while you were in the hospital after you are discharged, you can call the unit and asked to speak with the hospitalist on call if the hospitalist that took care of you is not available. Once you are discharged, your primary care physician will handle any further medical issues. Please note that NO REFILLS for any discharge medications will be authorized once you are discharged, as it is imperative that you return to your primary care  physician (or establish a relationship with a primary care physician if you do not have one) for your aftercare needs so that they can reassess your need for medications and monitor your lab values.    On the day of Discharge:  VITAL SIGNS:   Blood pressure 129/67, pulse 72, temperature 99.2 F (37.3 C), temperature source Oral, resp. rate 16, height 5\' 7"  (1.702 m), weight 62 kg (136 lb 11.2 oz), SpO2 97 %.  PHYSICAL EXAMINATION:     GENERAL:  46 y.o.-year-old patient sitting in the bed,  does not appear to be in any distress.  EYES: Pupils equal, round, reactive to light and accommodation. No scleral icterus. Extraocular muscles intact.  HEENT: Head atraumatic, normocephalic. Oropharynx and nasopharynx clear.  NECK:  Supple, no jugular venous distention. No thyroid enlargement, no tenderness.  LUNGS:much improved wheezing, and only minimal today, norales,rhonchi or crepitation. No use of accessory muscles of respiration. Good air movement bilaterally noted. CARDIOVASCULAR: S1, S2 normal. No rubs, or gallops. No murmurs  ABDOMEN: Soft, nontender, nondistended. Bowel sounds present. No organomegaly or mass.  EXTREMITIES: No pedal edema, cyanosis, or clubbing.  NEUROLOGIC: Cranial nerves II through XII are intact. Muscle strength 5/5 in all extremities. Sensation intact. Gait not checked.  PSYCHIATRIC: The patient is alert and oriented x 3.  SKIN: No obvious rash, lesion, or ulcer.     DATA REVIEW:   CBC Recent Labs  Lab 06/01/17 0412  WBC 17.4*  HGB 12.1  HCT 36.6  PLT 281    Chemistries  Recent Labs  Lab 06/02/17 0331  NA 135  K 4.3  CL 102  CO2 25  GLUCOSE 136*  BUN 14  CREATININE 0.59  CALCIUM 9.5     Microbiology Results  Results for orders placed or performed during the hospital encounter of 01/13/17  Wet prep, genital     Status: Abnormal   Collection Time: 01/13/17  1:04 AM  Result Value Ref Range Status   Yeast Wet Prep HPF POC NONE SEEN NONE SEEN  Final   Trich, Wet Prep NONE SEEN NONE SEEN Final   Clue Cells Wet Prep HPF POC NONE SEEN NONE SEEN Final   WBC, Wet Prep HPF POC FEW (A) NONE SEEN Final   Sperm NONE SEEN  Final  Chlamydia/NGC rt PCR ([facility] only)     Status: None   Collection Time: 01/13/17  1:04 AM  Result Value Ref Range Status   Specimen source GC/Chlam ENDOCERVICAL  Final   Chlamydia Tr NOT DETECTED NOT DETECTED Final   N gonorrhoeae NOT DETECTED NOT DETECTED Final    Comment: (NOTE) 100  This methodology has not been evaluated in pregnant women or in 200  patients with a history of hysterectomy. 300 400  This methodology will not be performed on patients less than 5114  years of age.     RADIOLOGY:  No results found.   Management plans discussed with the patient, family and they are in agreement.  CODE STATUS:     Code Status Orders  (From admission, onward)        Start     Ordered   05/31/17 1015  Full code  Continuous     05/31/17 1014    Code Status History    Date Active Date Inactive Code Status Order ID Comments User Context   This patient has a current code status but no historical code status.      TOTAL TIME TAKING CARE OF THIS PATIENT: 38 minutes.    Kim BaasKALISETTI,Lyann Hagstrom M.D on 06/02/2017 at 11:05 AM  Between 7am to 6pm - Pager - [phone]  After 6pm go to [email or web address] - Scientist, research (life sciences)password EPAS [facility]  [facility] [place] [facility]  Office  [phone]  CC: Primary care physician; Kim Howard, Kim H, NP   Note: This dictation was prepared with Dragon dictation along with smaller phrase technology. Any transcriptional errors that result from this process are unintentional.

## 2017-06-02 NOTE — Progress Notes (Signed)
Kim Howard to be D/C'd Home per MD order.  Discussed with the patient and all questions fully answered.  VSS, Skin clean, dry and intact without evidence of skin break down, no evidence of skin tears noted. IV catheter discontinued intact. Site without signs and symptoms of complications. Dressing and pressure applied.  An After Visit Summary was printed and given to the patient. Patient received prescription.  D/c education completed with patient/family including follow up instructions, medication list, d/c activities limitations if indicated, with other d/c instructions as indicated by MD - patient able to verbalize understanding, all questions fully answered.   Patient instructed to return to ED, call 911, or call MD for any changes in condition.   Patient escorted via WC, and D/C home via private auto.  Harvie HeckMelanie Aramis Weil 06/02/2017 11:42 AM

## 2017-06-04 ENCOUNTER — Telehealth: Payer: Self-pay | Admitting: Nurse Practitioner

## 2017-06-04 NOTE — Telephone Encounter (Signed)
Copied from CRM [phone]#[id number]. Topic: Quick Communication - See Telephone Encounter >> Jun 03, 2017  9:33 AM Oneal GroutSebastian, Jennifer S wrote: CRM for notification. See Telephone encounter for:  Need dx code for nebulizer.  06/03/17. / This is not a provider we answer for.  Called pharmacy back and advised they have contacted the wrong office.  Pharmacy staff says the received the correct information from the correct office.

## 2017-11-30 ENCOUNTER — Encounter: Payer: Self-pay | Admitting: Emergency Medicine

## 2017-11-30 ENCOUNTER — Emergency Department
Admission: EM | Admit: 2017-11-30 | Discharge: 2017-11-30 | Disposition: A | Discharge status: Home / Self Care | Payer: Medicaid Other | Attending: Emergency Medicine | Admitting: Emergency Medicine

## 2017-11-30 ENCOUNTER — Emergency Department: Payer: Medicaid Other

## 2017-11-30 DIAGNOSIS — Z79899 Other long term (current) drug therapy: Secondary | ICD-10-CM | POA: Insufficient documentation

## 2017-11-30 DIAGNOSIS — M549 Dorsalgia, unspecified: Secondary | ICD-10-CM | POA: Insufficient documentation

## 2017-11-30 DIAGNOSIS — F17210 Nicotine dependence, cigarettes, uncomplicated: Secondary | ICD-10-CM | POA: Insufficient documentation

## 2017-11-30 DIAGNOSIS — R0602 Shortness of breath: Secondary | ICD-10-CM | POA: Diagnosis present

## 2017-11-30 DIAGNOSIS — J209 Acute bronchitis, unspecified: Secondary | ICD-10-CM | POA: Diagnosis not present

## 2017-11-30 LAB — BASIC METABOLIC PANEL
ANION GAP: 10 (ref 5–15)
BUN: 8 mg/dL (ref 6–20)
CO2: 20 mmol/L — AB (ref 22–32)
CREATININE: 0.83 mg/dL (ref 0.44–1.00)
Calcium: 8.8 mg/dL — ABNORMAL LOW (ref 8.9–10.3)
Chloride: 106 mmol/L (ref 101–111)
GFR calc Af Amer: >60 mL/min (ref >60)
Glucose, Bld: 98 mg/dL (ref 65–99)
Potassium: 3.8 mmol/L (ref 3.5–5.1)
Sodium: 136 mmol/L (ref 135–145)

## 2017-11-30 LAB — CBC
HCT: 37.4 % (ref 35.0–47.0)
Hemoglobin: 12.7 g/dL (ref 12.0–16.0)
MCH: 32.4 pg (ref 26.0–34.0)
MCHC: 34.1 g/dL (ref 32.0–36.0)
MCV: 95.1 fL (ref 80.0–100.0)
PLATELETS: 270 10*3/uL (ref 150–440)
RBC: 3.93 MIL/uL (ref 3.80–5.20)
RDW: 12.6 % (ref 11.5–14.5)
WBC: 7.4 10*3/uL (ref 3.6–11.0)

## 2017-11-30 LAB — TROPONIN I

## 2017-11-30 MED ORDER — SODIUM CHLORIDE 0.9 % IV BOLUS
1000.0000 mL | Freq: Once | INTRAVENOUS | Status: AC
  Administered 2017-11-30: 1000 mL via INTRAVENOUS

## 2017-11-30 MED ORDER — ALBUTEROL SULFATE (2.5 MG/3ML) 0.083% IN NEBU
2.5000 mg | INHALATION_SOLUTION | Freq: Once | RESPIRATORY_TRACT | Status: AC
  Administered 2017-11-30: 2.5 mg via RESPIRATORY_TRACT
  Filled 2017-11-30: qty 3

## 2017-11-30 MED ORDER — IPRATROPIUM-ALBUTEROL 0.5-2.5 (3) MG/3ML IN SOLN
3.0000 mL | Freq: Once | RESPIRATORY_TRACT | Status: AC
  Administered 2017-11-30: 3 mL via RESPIRATORY_TRACT
  Filled 2017-11-30: qty 3

## 2017-11-30 MED ORDER — ALBUTEROL SULFATE (2.5 MG/3ML) 0.083% IN NEBU
5.0000 mg | INHALATION_SOLUTION | Freq: Once | RESPIRATORY_TRACT | Status: AC
  Administered 2017-11-30: 5 mg via RESPIRATORY_TRACT

## 2017-11-30 MED ORDER — ALBUTEROL SULFATE (2.5 MG/3ML) 0.083% IN NEBU: 2.5000 mg | INHALATION_SOLUTION | Freq: Four times a day (QID) | RESPIRATORY_TRACT | 1 refills | Status: DC | PRN

## 2017-11-30 MED ORDER — KETOROLAC TROMETHAMINE 30 MG/ML IJ SOLN
30.0000 mg | Freq: Once | INTRAMUSCULAR | Status: AC
  Administered 2017-11-30: 30 mg via INTRAVENOUS
  Filled 2017-11-30: qty 1

## 2017-11-30 MED ORDER — METHYLPREDNISOLONE SODIUM SUCC 125 MG IJ SOLR
125.0000 mg | Freq: Once | INTRAMUSCULAR | Status: AC
  Administered 2017-11-30: 125 mg via INTRAVENOUS
  Filled 2017-11-30: qty 2

## 2017-11-30 MED ORDER — ALBUTEROL SULFATE (2.5 MG/3ML) 0.083% IN NEBU
INHALATION_SOLUTION | RESPIRATORY_TRACT | Status: AC
  Administered 2017-11-30: 5 mg via RESPIRATORY_TRACT
  Filled 2017-11-30: qty 6

## 2017-11-30 MED ORDER — IBUPROFEN 600 MG PO TABS: 600.0000 mg | ORAL_TABLET | Freq: Four times a day (QID) | ORAL | 0 refills | Status: DC | PRN

## 2017-11-30 MED ORDER — PREDNISONE 20 MG PO TABS: 60.0000 mg | ORAL_TABLET | Freq: Every day | ORAL | 0 refills | Status: AC

## 2017-11-30 MED ORDER — ALBUTEROL SULFATE HFA 108 (90 BASE) MCG/ACT IN AERS: 2.0000 | INHALATION_SPRAY | Freq: Four times a day (QID) | RESPIRATORY_TRACT | 0 refills | Status: DC | PRN

## 2017-11-30 NOTE — ED Notes (Signed)
Pt ambulatory to wheel chair upon discharge. Verbalized understanding of discharge instructions, follow-up care and prescriptions. VSS. Skin warm and dry. A&O x4.   This RN wheeled pt out and up to car in parking lot d/t dyspnea upon exertion with bronchitis dx.   E-signature pad not working [facility]. Pt signed physical copy of discharge. This RN placed signed discharge paperwork in medical records bin.

## 2017-11-30 NOTE — ED Provider Notes (Signed)
[facility] Emergency Department Provider Note ____________________________________________   First MD Initiated Contact with Patient 11/30/17 [phone]     (approximate)  I have reviewed the triage vital signs and the nursing notes.   HISTORY  Chief Complaint Shortness of Breath and Chest Pain   HPI Kim Howard is a 47 y.o. female with PMH as noted below (unclear if any prior history of asthma) who presents with shortness of breath over the last 3 days, gradual onset, worsening, not relieved by nebulizer treatment at home.  Patient reports associated chest and back pain.  Past Medical History:  Diagnosis Date  . Depression   . Helicobacter pylori gastritis   . Irritable bowel syndrome (IBS)   . Tobacco use     Patient Active Problem List   Diagnosis Date Noted  . Pneumonitis 05/31/2017    History reviewed. No pertinent surgical history.  Prior to Admission medications   Medication Sig Start Date End Date Taking? Authorizing Provider  gabapentin (NEURONTIN) 100 MG capsule Take 100 mg by mouth 2 (two) times daily.   Yes [provider]  lurasidone (LATUDA) 40 MG TABS tablet Take 40 mg by mouth daily.   Yes [provider]  QUEtiapine (SEROQUEL) 100 MG tablet Take 100 mg by mouth 2 (two) times daily.   Yes [provider]  albuterol (PROVENTIL HFA;VENTOLIN HFA) 108 (90 Base) MCG/ACT inhaler Inhale 2 puffs into the lungs every 6 (six) hours as needed for wheezing or shortness of breath. 11/30/17   Dionne Bucy, MD  albuterol (PROVENTIL) (2.5 MG/3ML) 0.083% nebulizer solution Take 3 mLs (2.5 mg total) by nebulization every 6 (six) hours as needed for wheezing or shortness of breath. 11/30/17   Dionne Bucy, MD  benzonatate (TESSALON) 200 MG capsule Take 1 capsule (200 mg total) by mouth 3 (three) times daily. Patient not taking: Reported on 11/30/2017 06/02/17   Enid Baas, MD  chlorpheniramine-HYDROcodone  (TUSSIONEX) 10-8 MG/5ML SUER Take 5 mLs by mouth every 12 (twelve) hours. Patient not taking: Reported on 11/30/2017 06/02/17   Enid Baas, MD  ibuprofen (ADVIL,MOTRIN) 600 MG tablet Take 1 tablet (600 mg total) by mouth every 6 (six) hours as needed. 11/30/17   Dionne Bucy, MD  nicotine (NICODERM CQ - DOSED IN MG/24 HOURS) 14 mg/24hr patch Place 1 patch (14 mg total) onto the skin daily. Patient not taking: Reported on 11/30/2017 06/02/17 06/02/18  Enid Baas, MD  ondansetron (ZOFRAN ODT) 8 MG disintegrating tablet Take 1 tablet (8 mg total) by mouth every 8 (eight) hours as needed for nausea or vomiting. Patient not taking: Reported on 11/30/2017 05/25/15   Sharman Cheek, MD  pantoprazole (PROTONIX) 40 MG tablet Take 1 tablet (40 mg total) by mouth 2 (two) times daily before a meal. Patient not taking: Reported on 11/30/2017 06/02/17   Enid Baas, MD  predniSONE (DELTASONE) 20 MG tablet Take 3 tablets (60 mg total) by mouth daily with breakfast for 4 days. 12/01/17 12/05/17  Dionne Bucy, MD  ranitidine (ZANTAC) 150 MG capsule Take 1 capsule (150 mg total) by mouth 2 (two) times daily. Patient not taking: Reported on 11/30/2017 05/25/15   Sharman Cheek, MD  sucralfate (CARAFATE) 1 GM/10ML suspension Take 10 mLs (1 g total) by mouth 4 (four) times daily -  with meals and at bedtime. Patient not taking: Reported on 11/30/2017 06/02/17   Enid Baas, MD  traMADol (ULTRAM) 50 MG tablet Take 1 tablet (50 mg total) by mouth every 6 (six) hours  as needed. Patient not taking: Reported on 11/30/2017 06/02/17   Enid BaasKalisetti, Radhika, MD    Allergies Penicillins  Family History  Problem Relation Age of Onset  . Prostate cancer Father     Social History Social History   Tobacco Use  . Smoking status: Current Every Day Smoker    Packs/day: 1.00    Types: Cigarettes  . Smokeless tobacco: Never Used  Substance Use Topics  . Alcohol use: No  . Drug use: No    Review  of Systems  Constitutional: No fever. Eyes: No redness. ENT: No sore throat. Cardiovascular: Positive for chest pain. Respiratory: Positive for shortness of breath. Gastrointestinal: No vomiting Genitourinary: Negative for dysuria.  Musculoskeletal: Positive for back pain. Skin: Negative for rash. Neurological: Negative for headache.   ____________________________________________   PHYSICAL EXAM:  VITAL SIGNS: ED Triage Vitals [11/30/17 0705]  Enc Vitals Group     BP 128/78     Pulse Rate 81     Resp 18     Temp 98.2 F (36.8 C)     Temp Source Oral     SpO2 100 %     Weight 140 lb (63.5 kg)     Height 5\' 7"  (1.702 m)     Head Circumference      Peak Flow      Pain Score 10     Pain Loc      Pain Edu?      Excl. in GC?     Constitutional: Alert and oriented.  Uncomfortable appearing. Eyes: Conjunctivae are normal.  Head: Atraumatic. Nose: No congestion/rhinnorhea. Mouth/Throat: Mucous membranes are moist.  Oropharynx clear. Neck: Normal range of motion.  Cardiovascular: Normal rate, regular rhythm. Grossly normal heart sounds.  Good peripheral circulation. Respiratory: Increased respiratory effort.  No retractions.  Diffuse bilateral wheezing. Gastrointestinal: No distention.  Genitourinary: No CVA tenderness. Musculoskeletal: No lower extremity edema.  Extremities warm and well perfused.  Neurologic:  Normal speech and language. No gross focal neurologic deficits are appreciated.  Skin:  Skin is warm and dry. No rash noted. Psychiatric: Mood and affect are normal. Speech and behavior are normal.  ____________________________________________   LABS (all labs ordered are listed, but only abnormal results are displayed)  Labs Reviewed  BASIC METABOLIC PANEL - Abnormal; Notable for the following components:      Result Value   CO2 20 (*)    Calcium 8.8 (*)    All other components within normal limits  CBC  TROPONIN I  POC URINE PREG, ED    ____________________________________________  EKG  ED ECG REPORT I, Dionne BucySebastian Reshunda Strider, the attending physician, personally viewed and interpreted this ECG.  Date: 11/30/2017 EKG Time: 0706 Rate: 84 Rhythm: normal sinus rhythm QRS Axis: normal Intervals: normal ST/T Wave abnormalities: Nonspecific lateral T wave abnormalities Narrative Interpretation: no evidence of acute ischemia; no significant change when compared to EKG of 05/31/2017  ____________________________________________  RADIOLOGY  CXR: No focal infiltrate  ____________________________________________   PROCEDURES  Procedure(s) performed: No  Procedures  Critical Care performed: No ____________________________________________   INITIAL IMPRESSION / ASSESSMENT AND PLAN / ED COURSE  Pertinent labs & imaging results that were available during my care of the patient were reviewed by me and considered in my medical decision making (see chart for details).  47 year old female with PMH as noted above presents with shortness of breath over the last several days associated with chest and back pain and cough.  I [place] [facility] [place] [facility];  patient was admitted in December of last year for atypical pneumonia and bronchitis.  She states the symptoms today are somewhat similar.  On exam, the patient is uncomfortable appearing with increased respiratory effort but no acute respiratory distress, and her O2 saturation is in the high 90s on room air.  However she has diffuse bilateral wheezing with good air entry.  Differential includes pneumonia, bronchitis, asthma.  I have low suspicion for cardiac etiology.  There is no evidence of PE given the patient has no specific DVT or PE risk factors and given the abnormal lung exam.  Plan: Chest x-ray, nebs, steroid, analgesia, lab work-up, and reassess.  ----------------------------------------- 10:16 AM on  11/30/2017 -----------------------------------------  The patient is feeling much better after treatment.  She still has some wheezing, but it has improved and she has normal respiratory effort and O2 saturation of 100%.  Chest x-ray shows no focal infiltrate, and the lab work-up is unremarkable.  The patient would like to go home.  Overall presentation is consistent with acute bronchitis, likely viral.  I have prescribed the patient albuterol, prednisone, and ibuprofen for her musculoskeletal back pain.  Return precautions given, and the patient expresses understanding.   ____________________________________________   FINAL CLINICAL IMPRESSION(S) / ED DIAGNOSES  Final diagnoses:  Acute bronchitis, unspecified organism      NEW MEDICATIONS STARTED DURING THIS VISIT:  New Prescriptions   ALBUTEROL (PROVENTIL HFA;VENTOLIN HFA) 108 (90 BASE) MCG/ACT INHALER    Inhale 2 puffs into the lungs every 6 (six) hours as needed for wheezing or shortness of breath.   ALBUTEROL (PROVENTIL) (2.5 MG/3ML) 0.083% NEBULIZER SOLUTION    Take 3 mLs (2.5 mg total) by nebulization every 6 (six) hours as needed for wheezing or shortness of breath.   IBUPROFEN (ADVIL,MOTRIN) 600 MG TABLET    Take 1 tablet (600 mg total) by mouth every 6 (six) hours as needed.   PREDNISONE (DELTASONE) 20 MG TABLET    Take 3 tablets (60 mg total) by mouth daily with breakfast for 4 days.     Note:  This document was prepared using Dragon voice recognition software and may include unintentional dictation errors.    Dionne Bucy, MD 11/30/17 1017

## 2017-11-30 NOTE — ED Notes (Addendum)
Pt feeling "a lot better." No pain and O2 sats 100% on room air. Some end expiratory wheezes audible in bilateral bases.   NaCl bolus remains.

## 2017-11-30 NOTE — ED Triage Notes (Signed)
Patient with complaint of shortness of breath times three days that became worse this morning. Patient with complaint of left side chest pain that started this morning. Patient with audible wheezes unable to speak in compete sentences. Patent states that she used her nebulizer at home 4 hours ago.

## 2017-11-30 NOTE — ED Notes (Signed)
ED Provider at bedside. 

## 2017-11-30 NOTE — Discharge Instructions (Signed)
Take the prednisone as prescribed starting tomorrow and finish the full course.  You should use albuterol (either via the nebulizer at home, or the inhaler) every 4-6 hours for the next several days until your symptoms resolve.  You can take the ibuprofen as needed for pain.  Return to the ER for new, worsening, persistent shortness of breath, chest pain, back pain, weakness or lightheadedness, or any other new or worsening symptoms that concern you.

## 2017-12-19 ENCOUNTER — Emergency Department
Admission: EM | Admit: 2017-12-19 | Discharge: 2017-12-20 | Disposition: A | Discharge status: Home / Self Care | Payer: Medicaid Other | Attending: Emergency Medicine | Admitting: Emergency Medicine

## 2017-12-19 ENCOUNTER — Other Ambulatory Visit: Payer: Self-pay

## 2017-12-19 ENCOUNTER — Emergency Department: Payer: Medicaid Other

## 2017-12-19 DIAGNOSIS — R112 Nausea with vomiting, unspecified: Secondary | ICD-10-CM

## 2017-12-19 DIAGNOSIS — R1010 Upper abdominal pain, unspecified: Secondary | ICD-10-CM

## 2017-12-19 DIAGNOSIS — K5900 Constipation, unspecified: Secondary | ICD-10-CM | POA: Diagnosis not present

## 2017-12-19 DIAGNOSIS — K2900 Acute gastritis without bleeding: Secondary | ICD-10-CM | POA: Insufficient documentation

## 2017-12-19 DIAGNOSIS — F17210 Nicotine dependence, cigarettes, uncomplicated: Secondary | ICD-10-CM | POA: Insufficient documentation

## 2017-12-19 DIAGNOSIS — Z79899 Other long term (current) drug therapy: Secondary | ICD-10-CM | POA: Diagnosis not present

## 2017-12-19 LAB — URINALYSIS, COMPLETE (UACMP) WITH MICROSCOPIC
BACTERIA UA: NONE SEEN
BILIRUBIN URINE: NEGATIVE
Glucose, UA: NEGATIVE mg/dL
HGB URINE DIPSTICK: NEGATIVE
Ketones, ur: NEGATIVE mg/dL
NITRITE: NEGATIVE
PROTEIN: 30 mg/dL — AB
SPECIFIC GRAVITY, URINE: 1.026 (ref 1.005–1.030)
pH: 6 (ref 5.0–8.0)

## 2017-12-19 LAB — CBC
HEMATOCRIT: 39 % (ref 35.0–47.0)
HEMOGLOBIN: 13.1 g/dL (ref 12.0–16.0)
MCH: 31.6 pg (ref 26.0–34.0)
MCHC: 33.4 g/dL (ref 32.0–36.0)
MCV: 94.4 fL (ref 80.0–100.0)
Platelets: 310 10*3/uL (ref 150–440)
RBC: 4.14 MIL/uL (ref 3.80–5.20)
RDW: 12.4 % (ref 11.5–14.5)
WBC: 11.6 10*3/uL — AB (ref 3.6–11.0)

## 2017-12-19 LAB — COMPREHENSIVE METABOLIC PANEL
ALBUMIN: 4.1 g/dL (ref 3.5–5.0)
ALT: 12 U/L — ABNORMAL LOW (ref 14–54)
ANION GAP: 7 (ref 5–15)
AST: 18 U/L (ref 15–41)
Alkaline Phosphatase: 51 U/L (ref 38–126)
BILIRUBIN TOTAL: 0.6 mg/dL (ref 0.3–1.2)
BUN: 7 mg/dL (ref 6–20)
CO2: 24 mmol/L (ref 22–32)
Calcium: 9 mg/dL (ref 8.9–10.3)
Chloride: 104 mmol/L (ref 101–111)
Creatinine, Ser: 0.69 mg/dL (ref 0.44–1.00)
GFR calc Af Amer: >60 mL/min (ref >60)
Glucose, Bld: 93 mg/dL (ref 65–99)
POTASSIUM: 3.5 mmol/L (ref 3.5–5.1)
Sodium: 135 mmol/L (ref 135–145)
TOTAL PROTEIN: 7.6 g/dL (ref 6.5–8.1)

## 2017-12-19 LAB — TROPONIN I

## 2017-12-19 LAB — LIPASE, BLOOD: Lipase: 28 U/L (ref 11–51)

## 2017-12-19 MED ORDER — ONDANSETRON HCL 4 MG/2ML IJ SOLN
4.0000 mg | Freq: Once | INTRAMUSCULAR | Status: AC
  Administered 2017-12-19: 4 mg via INTRAVENOUS
  Filled 2017-12-19: qty 2

## 2017-12-19 MED ORDER — FAMOTIDINE IN NACL 20-0.9 MG/50ML-% IV SOLN
20.0000 mg | Freq: Once | INTRAVENOUS | Status: AC
Start: 2017-12-19 — End: 2017-12-20
  Administered 2017-12-19: 20 mg via INTRAVENOUS
  Filled 2017-12-19: qty 50

## 2017-12-19 MED ORDER — FENTANYL CITRATE (PF) 100 MCG/2ML IJ SOLN
50.0000 ug | Freq: Once | INTRAMUSCULAR | Status: AC
  Administered 2017-12-19: 50 ug via INTRAVENOUS
  Filled 2017-12-19: qty 2

## 2017-12-19 MED ORDER — SODIUM CHLORIDE 0.9 % IV BOLUS
1000.0000 mL | Freq: Once | INTRAVENOUS | Status: AC
  Administered 2017-12-19: 1000 mL via INTRAVENOUS

## 2017-12-19 MED ORDER — FAMOTIDINE IN NACL 20-0.9 MG/50ML-% IV SOLN
INTRAVENOUS | Status: AC
  Filled 2017-12-19: qty 50

## 2017-12-19 MED ORDER — ONDANSETRON 4 MG PO TBDP
4.0000 mg | ORAL_TABLET | Freq: Once | ORAL | Status: AC | PRN
  Administered 2017-12-19: 4 mg via ORAL
  Filled 2017-12-19: qty 1

## 2017-12-19 NOTE — ED Notes (Addendum)
POC negative per triage nurse, Eileen StanfordJenna RN

## 2017-12-19 NOTE — ED Provider Notes (Signed)
[facility] Emergency Department Provider Note   ____________________________________________   First MD Initiated Contact with Patient 12/19/17 2305     (approximate)  I have reviewed the triage vital signs and the nursing notes.   HISTORY  Chief Complaint Abdominal Pain    HPI Kim Howard is a 47 y.o. female who presents to the ED from home with a chief complaint of abdominal pain, nausea and dry heaves.  Patient has a history of IBS, H. pylori who has been experiencing upper abdominal pain for the past 2 weeks with nausea and vomiting.  Also has a history of constipation and has not had a bowel movement for the past week, which she states is not unusual for her.  States she feels like she did when she had H pylori.  Denies associated fever, chills, chest pain, shortness of breath, dysuria, diarrhea.  Denies recent travel or trauma.   Past Medical History:  Diagnosis Date  . Depression   . Helicobacter pylori gastritis   . Irritable bowel syndrome (IBS)   . Tobacco use     Patient Active Problem List   Diagnosis Date Noted  . Pneumonitis 05/31/2017    History reviewed. No pertinent surgical history.  Prior to Admission medications   Medication Sig Start Date End Date Taking? Authorizing Provider  albuterol (PROVENTIL HFA;VENTOLIN HFA) 108 (90 Base) MCG/ACT inhaler Inhale 2 puffs into the lungs every 6 (six) hours as needed for wheezing or shortness of breath. 11/30/17   Dionne Bucy, MD  albuterol (PROVENTIL) (2.5 MG/3ML) 0.083% nebulizer solution Take 3 mLs (2.5 mg total) by nebulization every 6 (six) hours as needed for wheezing or shortness of breath. 11/30/17   Dionne Bucy, MD  benzonatate (TESSALON) 200 MG capsule Take 1 capsule (200 mg total) by mouth 3 (three) times daily. Patient not taking: Reported on 11/30/2017 06/02/17   Enid Baas, MD  chlorpheniramine-HYDROcodone (TUSSIONEX) 10-8 MG/5ML SUER Take 5 mLs by mouth  every 12 (twelve) hours. Patient not taking: Reported on 11/30/2017 06/02/17   Enid Baas, MD  gabapentin (NEURONTIN) 100 MG capsule Take 100 mg by mouth 2 (two) times daily.    [provider]  ibuprofen (ADVIL,MOTRIN) 600 MG tablet Take 1 tablet (600 mg total) by mouth every 6 (six) hours as needed. 11/30/17   Dionne Bucy, MD  lactulose (CHRONULAC) 10 GM/15ML solution Take 30 mLs (20 g total) by mouth daily as needed for mild constipation. 12/20/17   Irean Hong, MD  lurasidone (LATUDA) 40 MG TABS tablet Take 40 mg by mouth daily.    [provider]  nicotine (NICODERM CQ - DOSED IN MG/24 HOURS) 14 mg/24hr patch Place 1 patch (14 mg total) onto the skin daily. Patient not taking: Reported on 11/30/2017 06/02/17 06/02/18  Enid Baas, MD  ondansetron (ZOFRAN ODT) 4 MG disintegrating tablet Take 1 tablet (4 mg total) by mouth every 8 (eight) hours as needed for nausea or vomiting. 12/20/17   Irean Hong, MD  oxyCODONE-acetaminophen (PERCOCET/ROXICET) 5-325 MG tablet Take 1 tablet by mouth every 4 (four) hours as needed for severe pain. 12/20/17   Irean Hong, MD  pantoprazole (PROTONIX) 40 MG tablet Take 1 tablet (40 mg total) by mouth daily. 12/20/17   Irean Hong, MD  QUEtiapine (SEROQUEL) 100 MG tablet Take 100 mg by mouth 2 (two) times daily.    [provider]  ranitidine (ZANTAC) 150 MG capsule Take 1 capsule (150 mg total) by mouth 2 (  two) times daily. Patient not taking: Reported on 11/30/2017 05/25/15   Sharman Cheek, MD  sucralfate (CARAFATE) 1 GM/10ML suspension Take 10 mLs (1 g total) by mouth 4 (four) times daily. 12/20/17   Irean Hong, MD  traMADol (ULTRAM) 50 MG tablet Take 1 tablet (50 mg total) by mouth every 6 (six) hours as needed. Patient not taking: Reported on 11/30/2017 06/02/17   Enid Baas, MD    Allergies Penicillins  Family History  Problem Relation Age of Onset  . Prostate cancer Father     Social History Social  History   Tobacco Use  . Smoking status: Current Every Day Smoker    Packs/day: 1.00    Types: Cigarettes  . Smokeless tobacco: Never Used  Substance Use Topics  . Alcohol use: No  . Drug use: No    Review of Systems  Constitutional: No fever/chills Eyes: No visual changes. ENT: No sore throat. Cardiovascular: Denies chest pain. Respiratory: Denies shortness of breath. Gastrointestinal: Positive for abdominal pain, nausea and vomiting.  No diarrhea.  Positive for constipation. Genitourinary: Negative for dysuria. Musculoskeletal: Negative for back pain. Skin: Negative for rash. Neurological: Negative for headaches, focal weakness or numbness.   ____________________________________________   PHYSICAL EXAM:  VITAL SIGNS: ED Triage Vitals  Enc Vitals Group     BP 12/19/17 2021 110/76     Pulse Rate 12/19/17 2021 84     Resp 12/19/17 2021 19     Temp 12/19/17 2021 98.1 F (36.7 C)     Temp Source 12/19/17 2021 Oral     SpO2 12/19/17 2021 96 %     Weight 12/19/17 2022 140 lb (63.5 kg)     Height --      Head Circumference --      Peak Flow --      Pain Score 12/19/17 2022 10     Pain Loc --      Pain Edu? --      Excl. in GC? --     Constitutional: Alert and oriented. Well appearing and in moderate acute distress. Eyes: Conjunctivae are normal. PERRL. EOMI. Head: Atraumatic. Nose: No congestion/rhinnorhea. Mouth/Throat: Mucous membranes are moist.  Oropharynx non-erythematous. Neck: No stridor.   Cardiovascular: Normal rate, regular rhythm. Grossly normal heart sounds.  Good peripheral circulation. Respiratory: Normal respiratory effort.  No retractions. Lungs CTAB. Gastrointestinal: Soft and mildly tender to palpation epigastrium without rebound or guarding. No distention. No abdominal bruits. No CVA tenderness. Musculoskeletal: No lower extremity tenderness nor edema.  No joint effusions. Neurologic:  Normal speech and language. No gross focal neurologic  deficits are appreciated. No gait instability. Skin:  Skin is warm, dry and intact. No rash noted. Psychiatric: Mood and affect are normal. Speech and behavior are normal.  ____________________________________________   LABS (all labs ordered are listed, but only abnormal results are displayed)  Labs Reviewed  COMPREHENSIVE METABOLIC PANEL - Abnormal; Notable for the following components:      Result Value   ALT 12 (*)    All other components within normal limits  CBC - Abnormal; Notable for the following components:   WBC 11.6 (*)    All other components within normal limits  URINALYSIS, COMPLETE (UACMP) WITH MICROSCOPIC - Abnormal; Notable for the following components:   Color, Urine AMBER (*)    APPearance CLEAR (*)    Protein, ur 30 (*)    Leukocytes, UA TRACE (*)    All other components within normal limits  LIPASE, BLOOD  TROPONIN I  POC URINE PREG, ED   ____________________________________________  EKG  ED ECG REPORT I, Terecia Plaut J, the attending physician, personally viewed and interpreted this ECG.   Date: 12/19/2017  EKG Time: 2018  Rate: 79  Rhythm: normal EKG, normal sinus rhythm  Axis: Normal  Intervals:none  ST&T Change: Nonspecific  ____________________________________________  RADIOLOGY  ED MD interpretation: X-rays demonstrate no SBO; I personally viewed the film and note moderate stool burden.  Ultrasound negative for cholelithiasis.  Official radiology report(s): Dg Abd Acute W/chest  Result Date: 12/19/2017 CLINICAL DATA:  Upper abdominal pain for 2 weeks. Nausea, vomiting, and constipation. Diagnosis of H pylori. EXAM: DG ABDOMEN ACUTE W/ 1V CHEST COMPARISON:  Chest 11/30/2017 FINDINGS: Pulmonary hyperinflation. Heart size and pulmonary vascularity are normal. Lungs are clear and expanded. Gas and stool scattered throughout the colon. No small or large bowel distention. No free intra-abdominal air. No abnormal air-fluid levels. No radiopaque  stones. Visualized bones and soft tissue contours are intact. Calcified phleboliths in the pelvis. IMPRESSION: No evidence of active pulmonary disease. Nonobstructive bowel gas pattern. Electronically Signed   By: Burman NievesWilliam  Stevens M.D.   On: 12/19/2017 23:49   [place] Abdomen Limited Ruq  Result Date: 12/20/2017 CLINICAL DATA:  Epigastric pain for 2 weeks EXAM: ULTRASOUND ABDOMEN LIMITED RIGHT UPPER QUADRANT COMPARISON:  CT 01/13/2017 FINDINGS: Gallbladder: No gallstones or wall thickening visualized. No sonographic Murphy sign noted by sonographer. Common bile duct: Diameter: 4.3 mm Liver: No focal lesion identified. Within normal limits in parenchymal echogenicity. Portal vein is patent on color Doppler imaging with normal direction of blood flow towards the liver. IMPRESSION: Negative right upper quadrant abdominal ultrasound Electronically Signed   By: Jasmine PangKim  Fujinaga M.D.   On: 12/20/2017 00:46    ____________________________________________   PROCEDURES  Procedure(s) performed: None  Procedures  Critical Care performed: No  ____________________________________________   INITIAL IMPRESSION / ASSESSMENT AND PLAN / ED COURSE  As part of my medical decision making, I reviewed the following data within the electronic MEDICAL RECORD NUMBER Nursing notes reviewed and incorporated, Labs reviewed, EKG interpreted, Old chart reviewed, Radiograph reviewed and Notes from prior ED visits   47 year old female with IBS and previous H. pylori gastritis who presents with a 2-week history of upper abdominal pain, nausea/vomiting, and constipation. Differential diagnosis includes, but is not limited to, biliary disease (biliary colic, acute cholecystitis, cholangitis, choledocholithiasis, etc), intrathoracic causes for epigastric abdominal pain including ACS, gastritis, duodenitis, pancreatitis, small bowel or large bowel obstruction, abdominal aortic aneurysm, hernia, and ulcer(s).  Laboratory and urinalysis  results unremarkable.  There are no ketones in patient's urine.  She is currently dry heaving.  Will initiate IV fluid resuscitation, 50 mcg IV fentanyl for pain, 4 mg IV Zofran for antiemetic, 20 mg IV Pepcid.  Will obtain plain film x-rays to evaluate for small bowel obstruction.  Reviewed patient's chart; looks like it has been since 2016 since she last had an abdominal ultrasound.  Will send for right upper quadrant abdominal ultrasound to evaluate for cholelithiasis/cholecystitis.   Clinical Course as of Dec 21 123  Fri Dec 20, 2017  0113 Patient feeling better.  Updated her of all test results.  She tells me she is not currently taking Protonix or Carafate as I reviewed in her records from the GI clinic in 05/2017.  I explained to patient H. pylori blood test is not as accurate as breath test or EGD.  Will discharge home with prescriptions for Protonix, Carafate, lactulose, Percocet and Zofran.  She is to follow-up with her GI doctor next week.  Strict return precautions given.  Patient verbalizes understanding and agrees with plan of care.   [JS]    Clinical Course User Index [JS] Irean Hong, MD     ____________________________________________   FINAL CLINICAL IMPRESSION(S) / ED DIAGNOSES  Final diagnoses:  Pain of upper abdomen  Acute gastritis without hemorrhage, unspecified gastritis type  Non-intractable vomiting with nausea, unspecified vomiting type  Constipation, unspecified constipation type     ED Discharge Orders        Ordered    pantoprazole (PROTONIX) 40 MG tablet  Daily     12/20/17 0123    sucralfate (CARAFATE) 1 GM/10ML suspension  4 times daily     12/20/17 0123    lactulose (CHRONULAC) 10 GM/15ML solution  Daily PRN     12/20/17 0123    ondansetron (ZOFRAN ODT) 4 MG disintegrating tablet  Every 8 hours PRN     12/20/17 0123    oxyCODONE-acetaminophen (PERCOCET/ROXICET) 5-325 MG tablet  Every 4 hours PRN     12/20/17 0123       Note:  This document  was prepared using Dragon voice recognition software and may include unintentional dictation errors.    Irean Hong, MD 12/20/17 [phone]

## 2017-12-19 NOTE — ED Triage Notes (Signed)
Patient c/o upper abdominal pain X 2 weeks with accompanying symptoms of N/V and constipation. Patient has dx of H. Pylori, and an appointment to see MD on 7/9. Patient reports she has not been able to tolerate food/fluids for over 1 week. Patient has not had a BM in 1 week.

## 2017-12-20 ENCOUNTER — Emergency Department: Payer: Medicaid Other

## 2017-12-20 LAB — POCT PREGNANCY, URINE: PREG TEST UR: NEGATIVE

## 2017-12-20 MED ORDER — OXYCODONE-ACETAMINOPHEN 5-325 MG PO TABS
1.0000 | ORAL_TABLET | Freq: Once | ORAL | Status: AC
  Administered 2017-12-20: 1 via ORAL
  Filled 2017-12-20: qty 1

## 2017-12-20 MED ORDER — LACTULOSE 10 GM/15ML PO SOLN: 20.0000 g | Freq: Every day | ORAL | 0 refills | Status: DC | PRN

## 2017-12-20 MED ORDER — PANTOPRAZOLE SODIUM 40 MG PO TBEC: 40.0000 mg | DELAYED_RELEASE_TABLET | Freq: Every day | ORAL | 0 refills | Status: DC

## 2017-12-20 MED ORDER — OXYCODONE-ACETAMINOPHEN 5-325 MG PO TABS: 1.0000 | ORAL_TABLET | ORAL | 0 refills | Status: DC | PRN

## 2017-12-20 MED ORDER — ONDANSETRON 4 MG PO TBDP: 4.0000 mg | ORAL_TABLET | Freq: Three times a day (TID) | ORAL | 0 refills | Status: DC | PRN

## 2017-12-20 MED ORDER — SUCRALFATE 1 GM/10ML PO SUSP: 1.0000 g | Freq: Four times a day (QID) | ORAL | 1 refills | Status: DC

## 2017-12-20 NOTE — Discharge Instructions (Addendum)
1.  Restart Protonix 40 mg daily. 2.  Restart Carafate 4 times daily. 3.  You may take Lactulose as needed for bowel movements. 4.  You may take pain and nausea medicines as needed (Percocet/Zofran). 5.  Avoid heavy, greasy, fatty foods and alcohol. 6.  Return to the ER for worsening symptoms, persistent vomiting, difficulty breathing or other concerns.

## 2018-01-07 ENCOUNTER — Encounter: Payer: Self-pay | Admitting: *Deleted

## 2018-01-08 ENCOUNTER — Ambulatory Visit: Payer: Medicaid Other | Admitting: Anesthesiology

## 2018-01-08 ENCOUNTER — Ambulatory Visit
Admission: RE | Admit: 2018-01-08 | Discharge: 2018-01-08 | Disposition: A | Discharge status: Home / Self Care | Payer: Medicaid Other | Source: Ambulatory Visit | Attending: Unknown Physician Specialty | Admitting: Unknown Physician Specialty

## 2018-01-08 ENCOUNTER — Encounter: Payer: Self-pay | Admitting: Student

## 2018-01-08 ENCOUNTER — Other Ambulatory Visit: Payer: Self-pay

## 2018-01-08 ENCOUNTER — Encounter
Admission: RE | Disposition: A | Discharge status: Home / Self Care | Payer: Self-pay | Source: Ambulatory Visit | Attending: Unknown Physician Specialty

## 2018-01-08 DIAGNOSIS — Z79899 Other long term (current) drug therapy: Secondary | ICD-10-CM | POA: Diagnosis not present

## 2018-01-08 DIAGNOSIS — Z880 Allergy status to penicillin: Secondary | ICD-10-CM | POA: Insufficient documentation

## 2018-01-08 DIAGNOSIS — K2980 Duodenitis without bleeding: Secondary | ICD-10-CM | POA: Insufficient documentation

## 2018-01-08 DIAGNOSIS — K2970 Gastritis, unspecified, without bleeding: Secondary | ICD-10-CM | POA: Insufficient documentation

## 2018-01-08 DIAGNOSIS — K589 Irritable bowel syndrome without diarrhea: Secondary | ICD-10-CM | POA: Diagnosis not present

## 2018-01-08 DIAGNOSIS — F329 Major depressive disorder, single episode, unspecified: Secondary | ICD-10-CM | POA: Diagnosis not present

## 2018-01-08 DIAGNOSIS — R112 Nausea with vomiting, unspecified: Secondary | ICD-10-CM | POA: Diagnosis present

## 2018-01-08 DIAGNOSIS — K269 Duodenal ulcer, unspecified as acute or chronic, without hemorrhage or perforation: Secondary | ICD-10-CM | POA: Insufficient documentation

## 2018-01-08 DIAGNOSIS — K219 Gastro-esophageal reflux disease without esophagitis: Secondary | ICD-10-CM | POA: Diagnosis not present

## 2018-01-08 DIAGNOSIS — B9681 Helicobacter pylori [H. pylori] as the cause of diseases classified elsewhere: Secondary | ICD-10-CM | POA: Insufficient documentation

## 2018-01-08 DIAGNOSIS — F17210 Nicotine dependence, cigarettes, uncomplicated: Secondary | ICD-10-CM | POA: Diagnosis not present

## 2018-01-08 HISTORY — PX: ESOPHAGOGASTRODUODENOSCOPY (EGD) WITH PROPOFOL: SHX5813

## 2018-01-08 HISTORY — DX: Gastro-esophageal reflux disease without esophagitis: K21.9

## 2018-01-08 LAB — POCT PREGNANCY, URINE: PREG TEST UR: NEGATIVE

## 2018-01-08 SURGERY — ESOPHAGOGASTRODUODENOSCOPY (EGD) WITH PROPOFOL
Anesthesia: General

## 2018-01-08 MED ORDER — FENTANYL CITRATE (PF) 100 MCG/2ML IJ SOLN
INTRAMUSCULAR | Status: DC | PRN
  Administered 2018-01-08 (×2): 50 ug via INTRAVENOUS

## 2018-01-08 MED ORDER — PROPOFOL 10 MG/ML IV BOLUS
INTRAVENOUS | Status: DC | PRN
  Administered 2018-01-08: 100 mg via INTRAVENOUS
  Administered 2018-01-08: 20 mg via INTRAVENOUS

## 2018-01-08 MED ORDER — PROPOFOL 500 MG/50ML IV EMUL
INTRAVENOUS | Status: AC
  Filled 2018-01-08: qty 50

## 2018-01-08 MED ORDER — FENTANYL CITRATE (PF) 100 MCG/2ML IJ SOLN
INTRAMUSCULAR | Status: AC
  Filled 2018-01-08: qty 2

## 2018-01-08 MED ORDER — PROPOFOL 500 MG/50ML IV EMUL
INTRAVENOUS | Status: DC | PRN
  Administered 2018-01-08: 150 ug/kg/min via INTRAVENOUS

## 2018-01-08 MED ORDER — SODIUM CHLORIDE 0.9 % IV SOLN: INTRAVENOUS | Status: DC

## 2018-01-08 MED ORDER — LIDOCAINE 2% (20 MG/ML) 5 ML SYRINGE
INTRAMUSCULAR | Status: DC | PRN
  Administered 2018-01-08: 30 mg via INTRAVENOUS

## 2018-01-08 MED ORDER — SODIUM CHLORIDE 0.9 % IV SOLN
INTRAVENOUS | Status: DC | PRN
  Administered 2018-01-08: 14:00:00 via INTRAVENOUS
  Administered 2018-01-08: 1000 mL

## 2018-01-08 NOTE — Transfer of Care (Signed)
Immediate Anesthesia Transfer of Care Note  Patient: Kim Howard  Procedure(s) Performed: ESOPHAGOGASTRODUODENOSCOPY (EGD) WITH PROPOFOL (N/A )  Patient Location: PACU and Endoscopy Unit  Anesthesia Type:General  Level of Consciousness: awake, drowsy and patient cooperative  Airway & Oxygen Therapy: Patient Spontanous Breathing and Patient connected to nasal cannula oxygen  Post-op Assessment: Report given to RN and Post -op Vital signs reviewed and stable  Post vital signs: Reviewed and stable  Last Vitals:  Vitals Value Taken Time  BP 105/80 01/08/2018  2:30 PM  Temp    Pulse 70 01/08/2018  2:30 PM  Resp 19 01/08/2018  2:31 PM  SpO2 100 % 01/08/2018  2:30 PM  Vitals shown include unvalidated device data.  Last Pain:  Vitals:   01/08/18 1343  TempSrc: Tympanic  PainSc: 0-No pain         Complications: No apparent anesthesia complications

## 2018-01-08 NOTE — H&P (Signed)
Primary Care Physician:  Fayrene HelperBoswell, Chelsa H, NP Primary Gastroenterologist:  Dr. Mechele CollinElliott  Pre-Procedure History & Physical: HPI:  Kim Howard is a 47 y.o. female is here for an endoscopy.  Done for epigastric pain and vomiting.   Past Medical History:  Diagnosis Date  . Depression   . GERD (gastroesophageal reflux disease)   . Helicobacter pylori gastritis   . Irritable bowel syndrome (IBS)   . Tobacco use     Past Surgical History:  Procedure Laterality Date  . INCISE AND DRAIN ABCESS      Prior to Admission medications   Medication Sig Start Date End Date Taking? Authorizing Provider  albuterol (PROVENTIL HFA;VENTOLIN HFA) 108 (90 Base) MCG/ACT inhaler Inhale 2 puffs into the lungs every 6 (six) hours as needed for wheezing or shortness of breath. 11/30/17  Yes Dionne BucySiadecki, Sebastian, MD  albuterol (PROVENTIL) (2.5 MG/3ML) 0.083% nebulizer solution Take 3 mLs (2.5 mg total) by nebulization every 6 (six) hours as needed for wheezing or shortness of breath. 11/30/17  Yes Dionne BucySiadecki, Sebastian, MD  dicyclomine (BENTYL) 10 MG capsule Take 10 mg by mouth 4 (four) times daily.   Yes [provider]  etonogestrel (NEXPLANON) 68 MG IMPL implant 1 each by Subdermal route once.   Yes [provider]  gabapentin (NEURONTIN) 100 MG capsule Take 100 mg by mouth 2 (two) times daily.   Yes [provider]  lurasidone (LATUDA) 40 MG TABS tablet Take 40 mg by mouth daily.   Yes [provider]  ondansetron (ZOFRAN ODT) 4 MG disintegrating tablet Take 1 tablet (4 mg total) by mouth every 8 (eight) hours as needed for nausea or vomiting. 12/20/17  Yes Irean HongSung, Jade J, MD  QUEtiapine (SEROQUEL) 100 MG tablet Take 100 mg by mouth 2 (two) times daily.   Yes [provider]  sucralfate (CARAFATE) 1 GM/10ML suspension Take 10 mLs (1 g total) by mouth 4 (four) times daily. 12/20/17  Yes Irean HongSung, Jade J, MD  benzonatate (TESSALON) 200 MG capsule Take 1 capsule (200 mg  total) by mouth 3 (three) times daily. Patient not taking: Reported on 11/30/2017 06/02/17   Enid BaasKalisetti, Radhika, MD  chlorpheniramine-HYDROcodone (TUSSIONEX) 10-8 MG/5ML SUER Take 5 mLs by mouth every 12 (twelve) hours. Patient not taking: Reported on 11/30/2017 06/02/17   Enid BaasKalisetti, Radhika, MD  CYCLOBENZAPRINE HCL ER PO Take by mouth daily.    [provider]  ibuprofen (ADVIL,MOTRIN) 600 MG tablet Take 1 tablet (600 mg total) by mouth every 6 (six) hours as needed. Patient not taking: Reported on 01/08/2018 11/30/17   Dionne BucySiadecki, Sebastian, MD  lactulose (CHRONULAC) 10 GM/15ML solution Take 30 mLs (20 g total) by mouth daily as needed for mild constipation. Patient not taking: Reported on 01/08/2018 12/20/17   Irean HongSung, Jade J, MD  nicotine (NICODERM CQ - DOSED IN MG/24 HOURS) 14 mg/24hr patch Place 1 patch (14 mg total) onto the skin daily. Patient not taking: Reported on 11/30/2017 06/02/17 06/02/18  Enid BaasKalisetti, Radhika, MD  oxyCODONE-acetaminophen (PERCOCET/ROXICET) 5-325 MG tablet Take 1 tablet by mouth every 4 (four) hours as needed for severe pain. Patient not taking: Reported on 01/08/2018 12/20/17   Irean HongSung, Jade J, MD  pantoprazole (PROTONIX) 40 MG tablet Take 1 tablet (40 mg total) by mouth daily. Patient not taking: Reported on 01/08/2018 12/20/17   Irean HongSung, Jade J, MD  ranitidine (ZANTAC) 150 MG capsule Take 1 capsule (150 mg total) by mouth 2 (two) times daily. Patient not taking: Reported on 11/30/2017 05/25/15  Sharman Cheek, MD  traMADol (ULTRAM) 50 MG tablet Take 1 tablet (50 mg total) by mouth every 6 (six) hours as needed. Patient not taking: Reported on 11/30/2017 06/02/17   Enid Baas, MD    Allergies as of 01/07/2018 - Review Complete 01/07/2018  Allergen Reaction Noted  . Penicillins Hives and Swelling 05/25/2015    Family History  Problem Relation Age of Onset  . Prostate cancer Father     Social History   Socioeconomic History  . Marital status: Single    Spouse name:  Not on file  . Number of children: Not on file  . Years of education: Not on file  . Highest education level: Not on file  Occupational History  . Not on file  Social Needs  . Financial resource strain: Not on file  . Food insecurity:    Worry: Not on file    Inability: Not on file  . Transportation needs:    Medical: Not on file    Non-medical: Not on file  Tobacco Use  . Smoking status: Current Every Day Smoker    Packs/day: 1.00    Types: Cigarettes  . Smokeless tobacco: Never Used  Substance and Sexual Activity  . Alcohol use: No  . Drug use: Yes    Types: Marijuana  . Sexual activity: Not on file  Lifestyle  . Physical activity:    Days per week: Not on file    Minutes per session: Not on file  . Stress: Not on file  Relationships  . Social connections:    Talks on phone: Not on file    Gets together: Not on file    Attends religious service: Not on file    Active member of club or organization: Not on file    Attends meetings of clubs or organizations: Not on file    Relationship status: Not on file  . Intimate partner violence:    Fear of current or ex partner: Not on file    Emotionally abused: Not on file    Physically abused: Not on file    Forced sexual activity: Not on file  Other Topics Concern  . Not on file  Social History Narrative   Lives at home, independent at baseline    Review of Systems: See HPI, otherwise negative ROS  Physical Exam: BP 132/86   Pulse 64   Temp (!) 96.7 F (35.9 C) (Tympanic)   Resp 18   Ht 5\' 7"  (1.702 m)   Wt 60.3 kg (133 lb)   SpO2 100%   BMI 20.83 kg/m  General:   Alert,  pleasant and cooperative in NAD Head:  Normocephalic and atraumatic. Neck:  Supple; no masses or thyromegaly. Lungs:  Clear throughout to auscultation.    Heart:  Regular rate and rhythm. Abdomen:  Soft, nontender and nondistended. Normal bowel sounds, without guarding, and without rebound.   Neurologic:  Alert and  oriented x4;  grossly  normal neurologically.  Impression/Plan: Kim Maw is here for an endoscopy to be performed for epigastric pain, nausea and vomiting.  Risks, benefits, limitations, and alternatives regarding  endoscopy have been reviewed with the patient.  Questions have been answered.  All parties agreeable.   Lynnae Prude, MD  01/08/2018, 2:01 PM

## 2018-01-08 NOTE — Op Note (Addendum)
[facility] Gastroenterology Patient Name: Kim Howard Procedure Date: 01/08/2018 1:42 PM MRN: [id number] Account #: [id number] Date of Birth: June 05, 1971 Admit Type: Outpatient Age: [age] Room: [facility] ENDO ROOM 3 Gender: Female Note Status: Finalized Procedure:            Upper GI endoscopy Indications:          Epigastric abdominal pain, Nausea with vomiting Providers:            Scot Junobert T. Salimata Christenson, MD Referring MD:         Meryl CrutchBoswell, Chelsea Medicines:            Propofol per Anesthesia Complications:        No immediate complications. Procedure:            Pre-Anesthesia Assessment:                       - After reviewing the risks and benefits, the patient                        was deemed in satisfactory condition to undergo the                        procedure.                       After obtaining informed consent, the endoscope was                        passed under direct vision. Throughout the procedure,                        the patient's blood pressure, pulse, and oxygen                        saturations were monitored continuously. The Endoscope                        was introduced through the mouth, and advanced to the                        second part of duodenum. The upper GI endoscopy was                        accomplished without difficulty. The patient tolerated                        the procedure well. Findings:      Diffuse mild erythema was found in the distal esophagus. Biopsies were       taken with a cold forceps for histology.      Diffuse, scattered and patchy moderate inflammation characterized by       erythema, friability and granularity was found in the gastric body and       in the gastric antrum. Biopsies were taken with a cold forceps for       histology. Biopsies were taken with a cold forceps for Helicobacter       pylori testing.      One non-bleeding superficial duodenal ulcer with no stigmata of bleeding       was found  in the duodenal bulb. The lesion was 6 mm in largest dimension.  Localized moderate inflammation characterized by congestion (edema),       erythema and granularity was found in the duodenal bulb. Impression:           - Erythema in the distal esophagus. Biopsied.                       - Gastritis. Biopsied.                       - One non-bleeding duodenal ulcer with no stigmata of                        bleeding.                       - Duodenitis. Recommendation:       - Await pathology results. Eat very soft food, take                        medicine twice a day for ulcer, crush your medicine so                        it will pass through,                       start Carafate pills BUT CRUSH them and put in 1-2                        ounces of water. Take 2-3 times a day.                       follow up in office in one week. Scot Jun, MD 01/08/2018 2:36:19 PM This report has been signed electronically. Number of Addenda: 0 Note Initiated On: 01/08/2018 1:42 PM      [facility]

## 2018-01-08 NOTE — Anesthesia Preprocedure Evaluation (Signed)
Anesthesia Evaluation  Patient identified by MRN, date of birth, ID band Patient awake    Reviewed: Allergy & Precautions, H&P , NPO status , reviewed documented beta blocker date and time   Airway Mallampati: II  TM Distance: >3 FB Neck ROM: full    Dental  (+) Upper Dentures   Pulmonary Current Smoker,    Pulmonary exam normal        Cardiovascular Normal cardiovascular exam     Neuro/Psych PSYCHIATRIC DISORDERS Depression    GI/Hepatic   Endo/Other    Renal/GU      Musculoskeletal   Abdominal   Peds  Hematology   Anesthesia Other Findings Past Medical History: No date: Depression No date: Helicobacter pylori gastritis No date: Irritable bowel syndrome (IBS) No date: Tobacco use  History reviewed. No pertinent surgical history.     Reproductive/Obstetrics                             Anesthesia Physical Anesthesia Plan  ASA: II  Anesthesia Plan: General   Post-op Pain Management:    Induction:   PONV Risk Score and Plan: 2 and Treatment may vary due to age or medical condition and TIVA  Airway Management Planned:   Additional Equipment:   Intra-op Plan:   Post-operative Plan:   Informed Consent: I have reviewed the patients History and Physical, chart, labs and discussed the procedure including the risks, benefits and alternatives for the proposed anesthesia with the patient or authorized representative who has indicated his/her understanding and acceptance.   Dental Advisory Given  Plan Discussed with: CRNA  Anesthesia Plan Comments:         Anesthesia Quick Evaluation

## 2018-01-08 NOTE — Anesthesia Post-op Follow-up Note (Signed)
Anesthesia QCDR form completed.        

## 2018-01-09 NOTE — Anesthesia Postprocedure Evaluation (Signed)
Anesthesia Post Note  Patient: Kim Howard  Procedure(s) Performed: ESOPHAGOGASTRODUODENOSCOPY (EGD) WITH PROPOFOL (N/A )  Patient location during evaluation: Endoscopy Anesthesia Type: General Level of consciousness: awake and alert Pain management: pain level controlled Vital Signs Assessment: post-procedure vital signs reviewed and stable Respiratory status: spontaneous breathing, nonlabored ventilation and respiratory function stable Cardiovascular status: blood pressure returned to baseline and stable Postop Assessment: no apparent nausea or vomiting Anesthetic complications: no     Last Vitals:  Vitals:   01/08/18 1432 01/08/18 1440  BP: 105/80 131/75  Pulse:  (!) 57  Resp: 19 14  Temp: (!) 36.4 C   SpO2: 100% 100%    Last Pain:  Vitals:   01/08/18 1343  TempSrc: Tympanic  PainSc: 0-No pain                 Christia ReadingScott T Mckinzi Eriksen

## 2018-01-10 LAB — SURGICAL PATHOLOGY

## 2018-01-12 ENCOUNTER — Encounter: Payer: Self-pay | Admitting: Unknown Physician Specialty

## 2018-10-16 IMAGING — US US ABDOMEN LIMITED
1 series · 14 of 25 positions shown · non-contrast
Comparison: CT 01/13/2017

CLINICAL DATA: Epigastric pain for 2 weeks

EXAM:
ULTRASOUND ABDOMEN LIMITED RIGHT UPPER QUADRANT

[Series 1: us abdomen limited · 0.16mm/px · 14 of 41 slices shown]
[im 1/41]
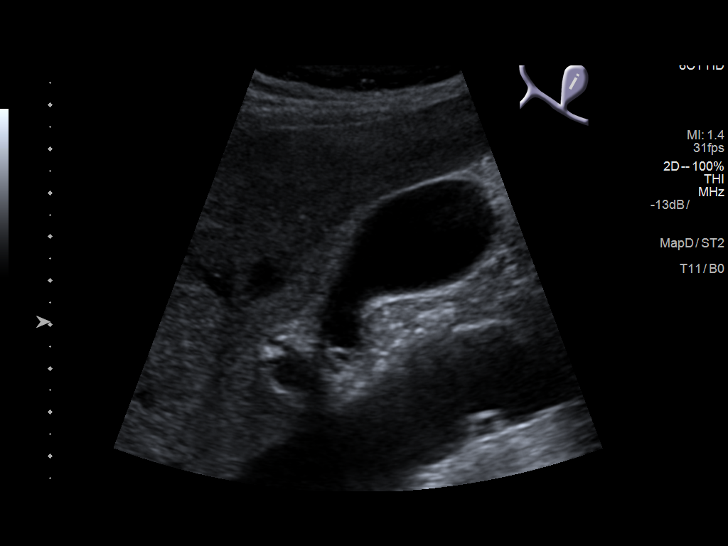
[im 4/41]
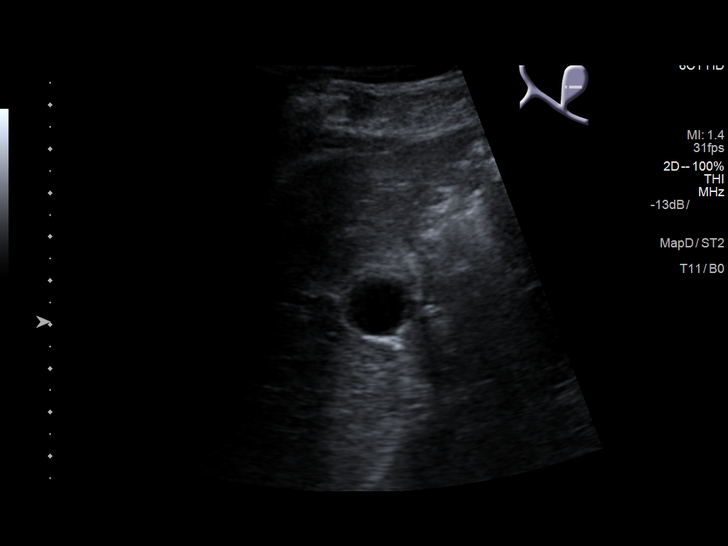
[im 7/41]
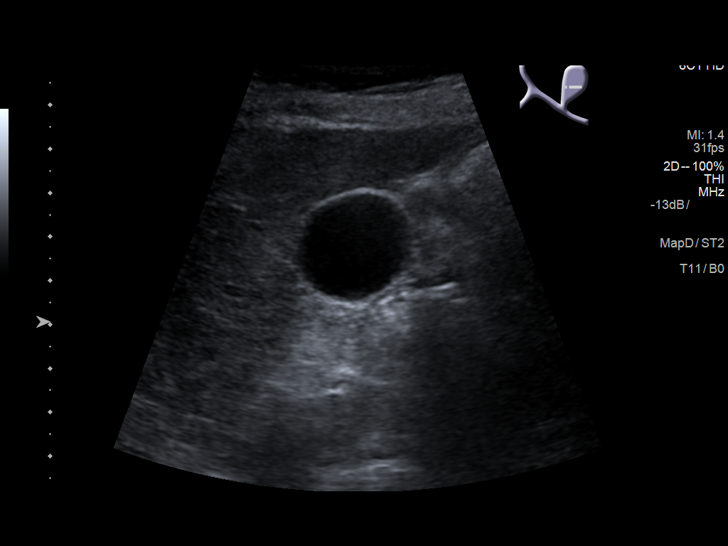
[im 11/41]
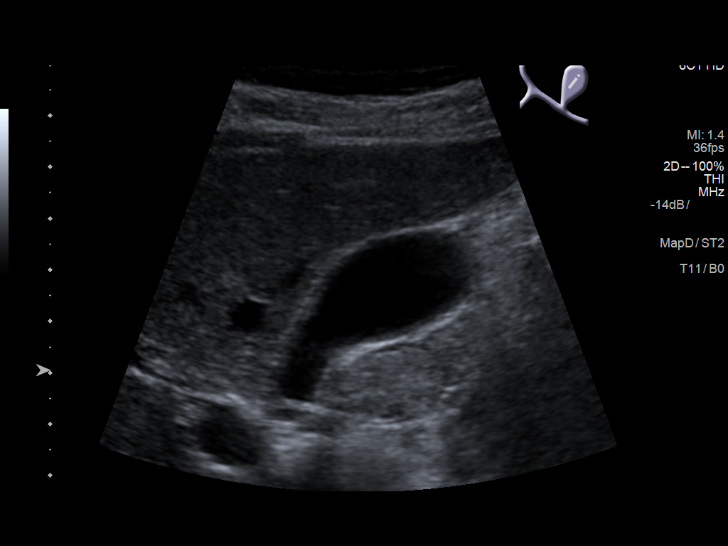
[im 14/41]
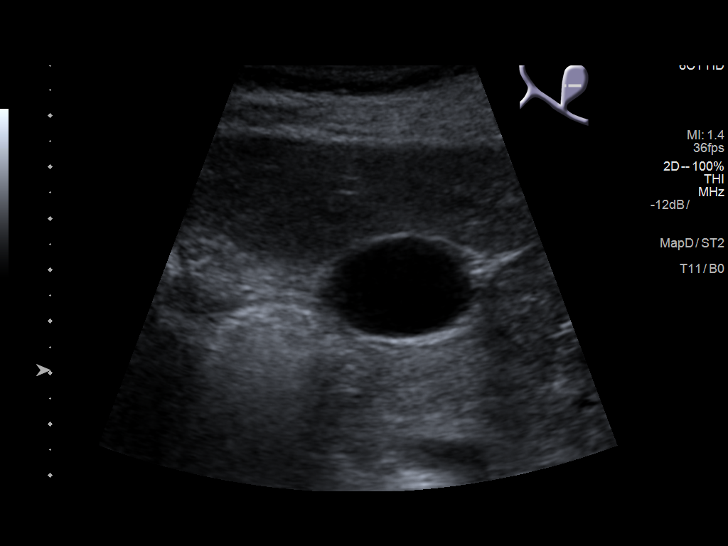
[im 16/41]
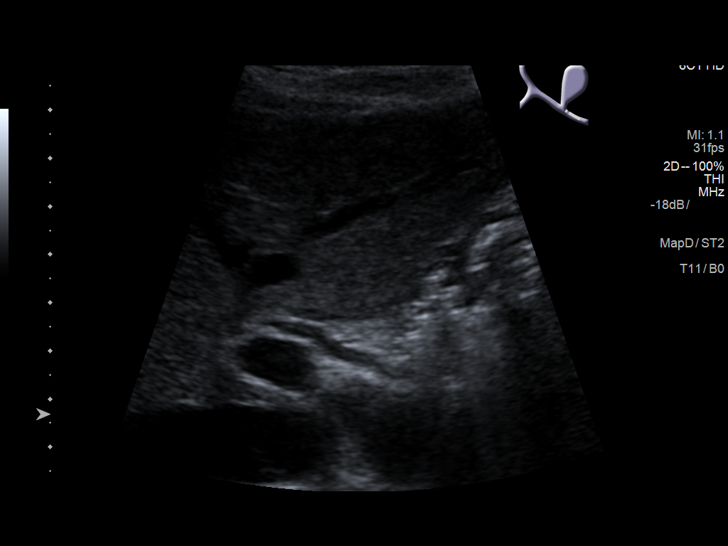
[im 19/41]
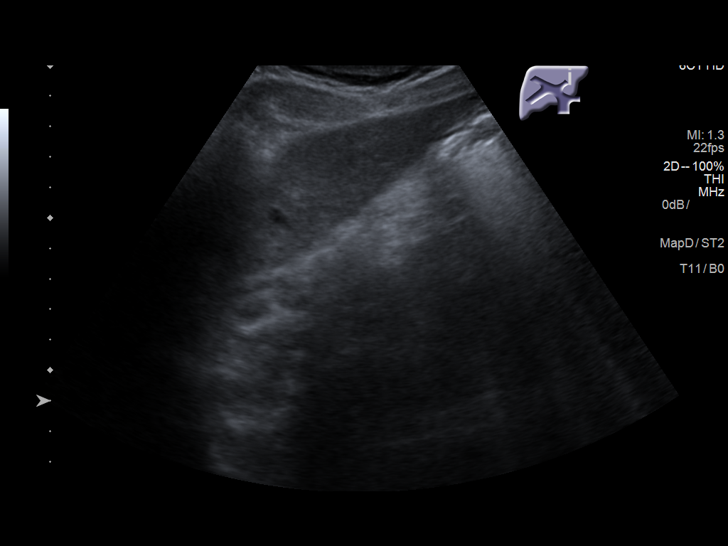
[im 22/41]
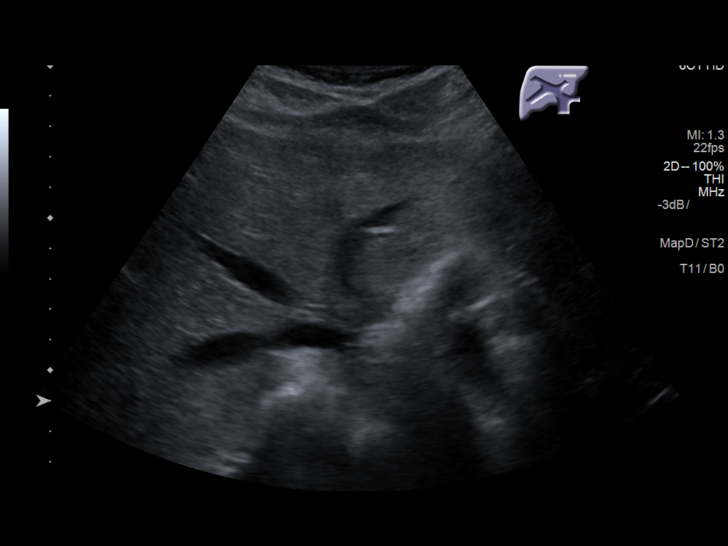
[im 26/41]
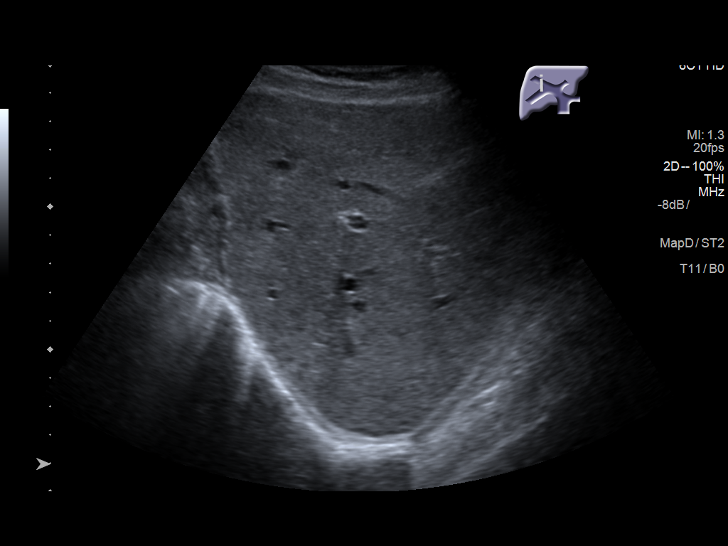
[im 27/41]
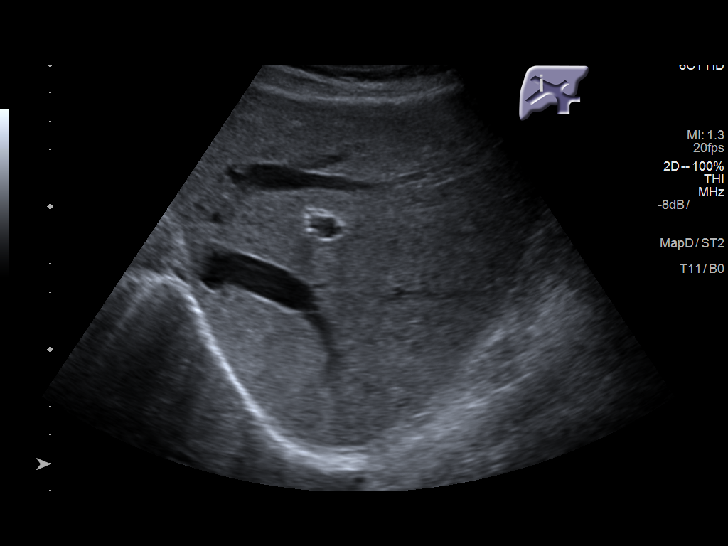
[im 31/41]
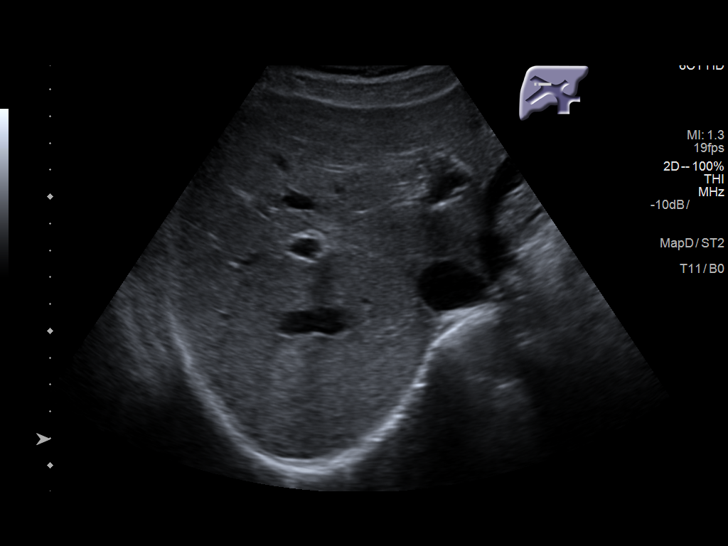
[im 34/41]
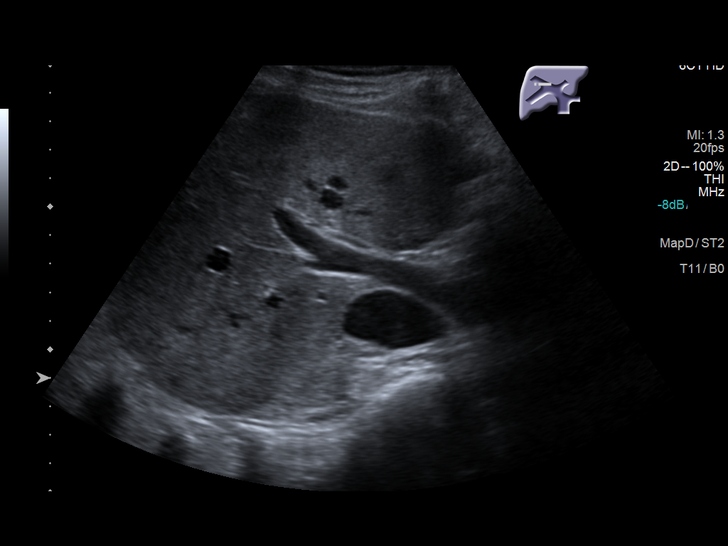
[im 37/41]
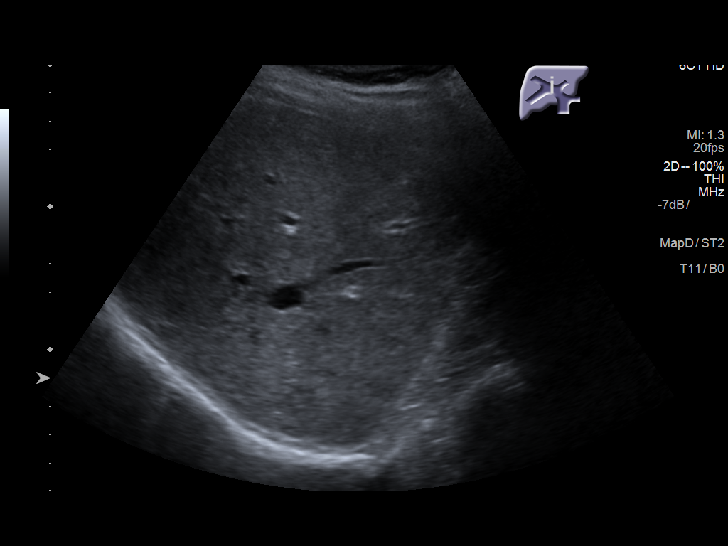
[im 41/41]
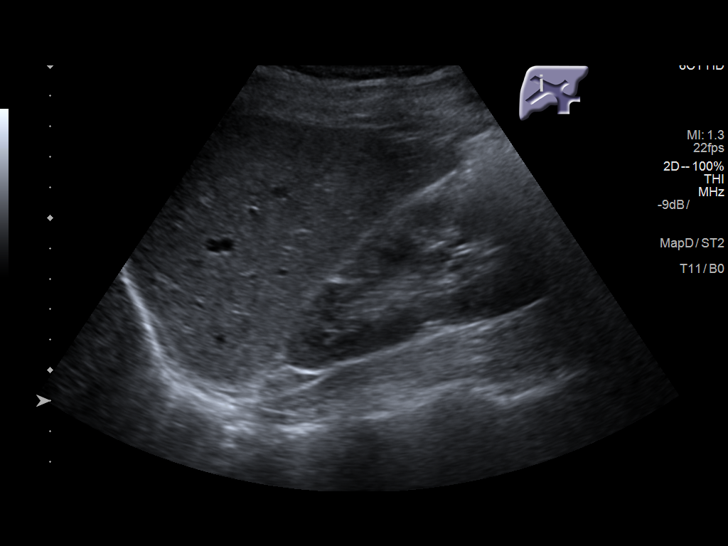

[14 of 25 positions shown; findings below may reference images not displayed]

FINDINGS: Gallbladder:

No gallstones or wall thickening visualized. No sonographic Murphy
sign noted by sonographer.

Common bile duct:

Diameter: 4.3 mm

Liver:

No focal lesion identified. Within normal limits in parenchymal
echogenicity. Portal vein is patent on color Doppler imaging with
normal direction of blood flow towards the liver.
IMPRESSION: Negative right upper quadrant abdominal ultrasound

## 2019-05-01 IMAGING — DX DG CHEST 1V PORT
1 series · 1 of 1 positions shown · non-contrast
Comparison: 05/31/2017

CLINICAL DATA: Patient with complaint of shortness of breath times
three days that became worse this morning. Patient with complaint of
left side chest pain that started this morning. Patient with audible
wheezes unable to speak in compete sentences. Patent states that she
used her nebulizer at home 4 hours ago. Current smoker 1 ppd.

EXAM:
PORTABLE CHEST 1 VIEW

[chest ap]
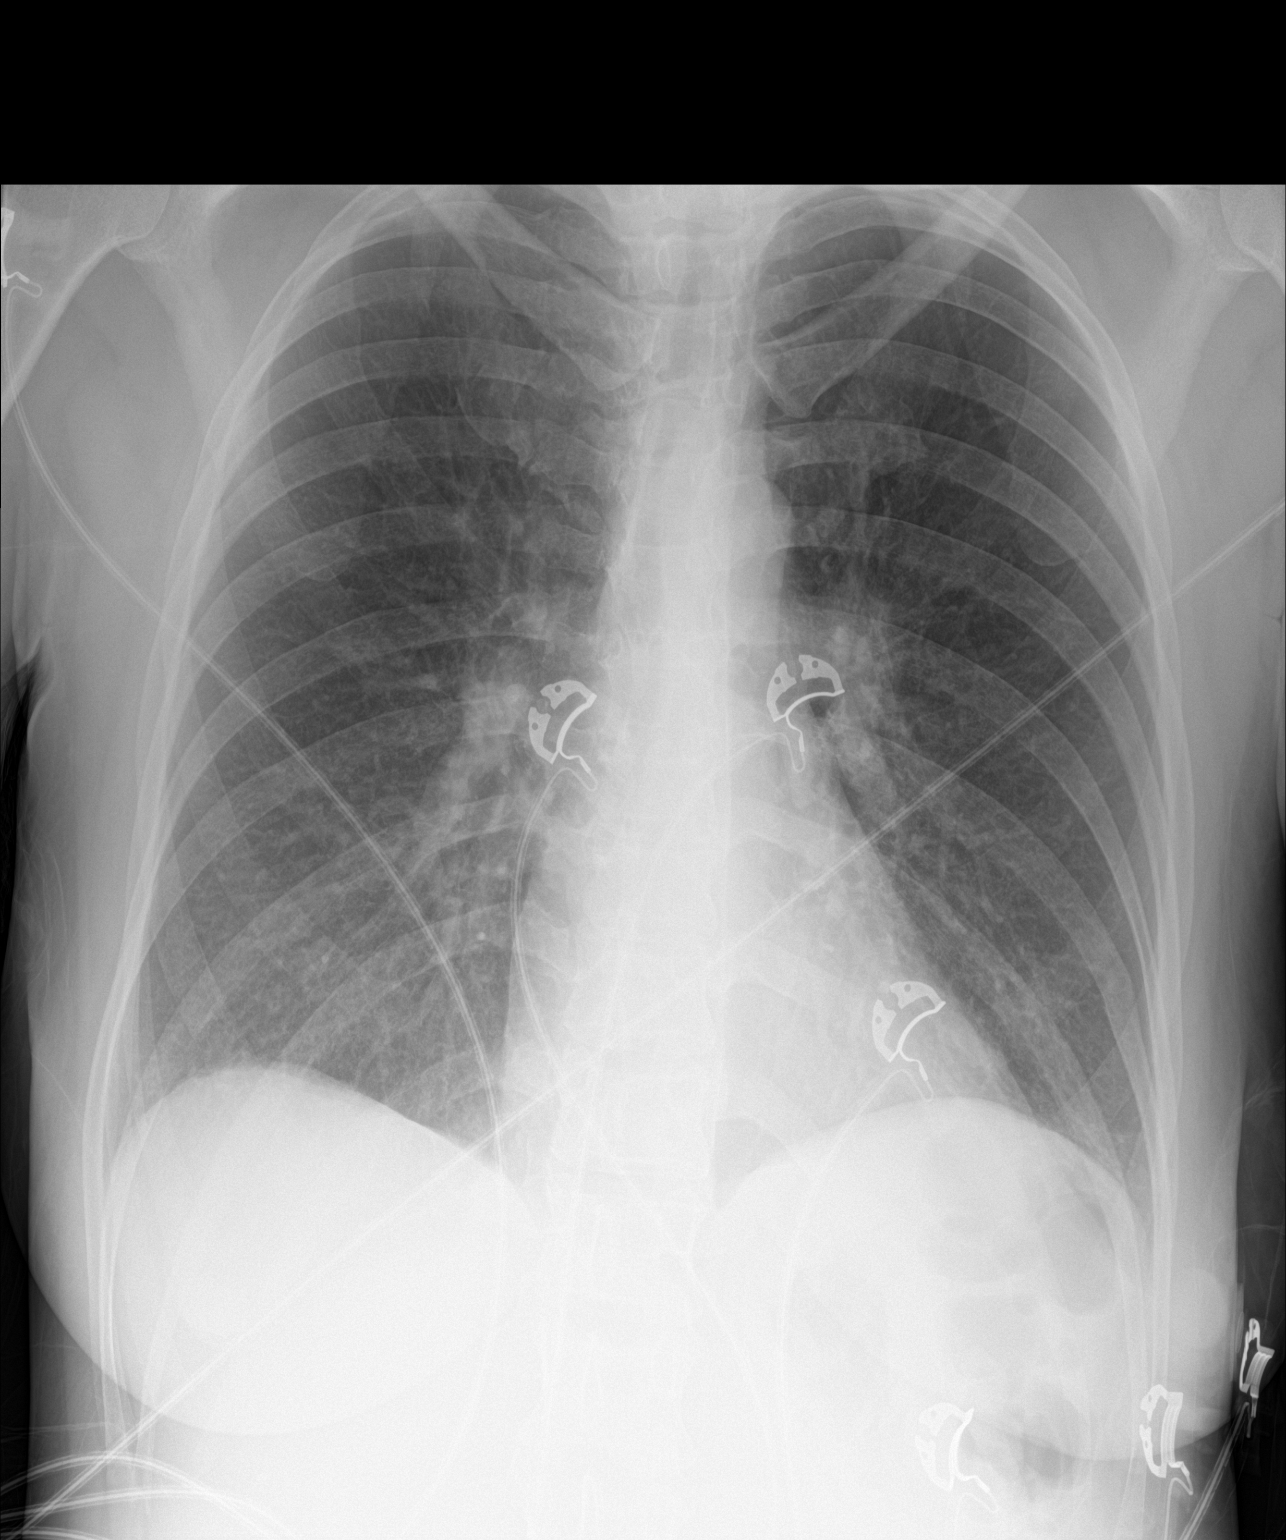

[1 of 1 positions shown; findings below may reference images not displayed]

FINDINGS: Cardiac silhouette is normal in size. No mediastinal or hilar
masses. Lungs are hyperexpanded with prominent bronchovascular
markings consistent with smoking related lung disease. No evidence
of pneumonia or pulmonary edema.

No pleural effusion or pneumothorax.

Skeletal structures are intact.
IMPRESSION: No acute cardiopulmonary disease.

## 2019-05-20 IMAGING — CR DG ABDOMEN ACUTE W/ 1V CHEST
3 series · 3 of 3 positions shown · non-contrast
Comparison: Chest 11/30/2017

CLINICAL DATA: Upper abdominal pain for 2 weeks. Nausea, vomiting,
and constipation. Diagnosis of H pylori.

EXAM:
DG ABDOMEN ACUTE W/ 1V CHEST

[chest pa]
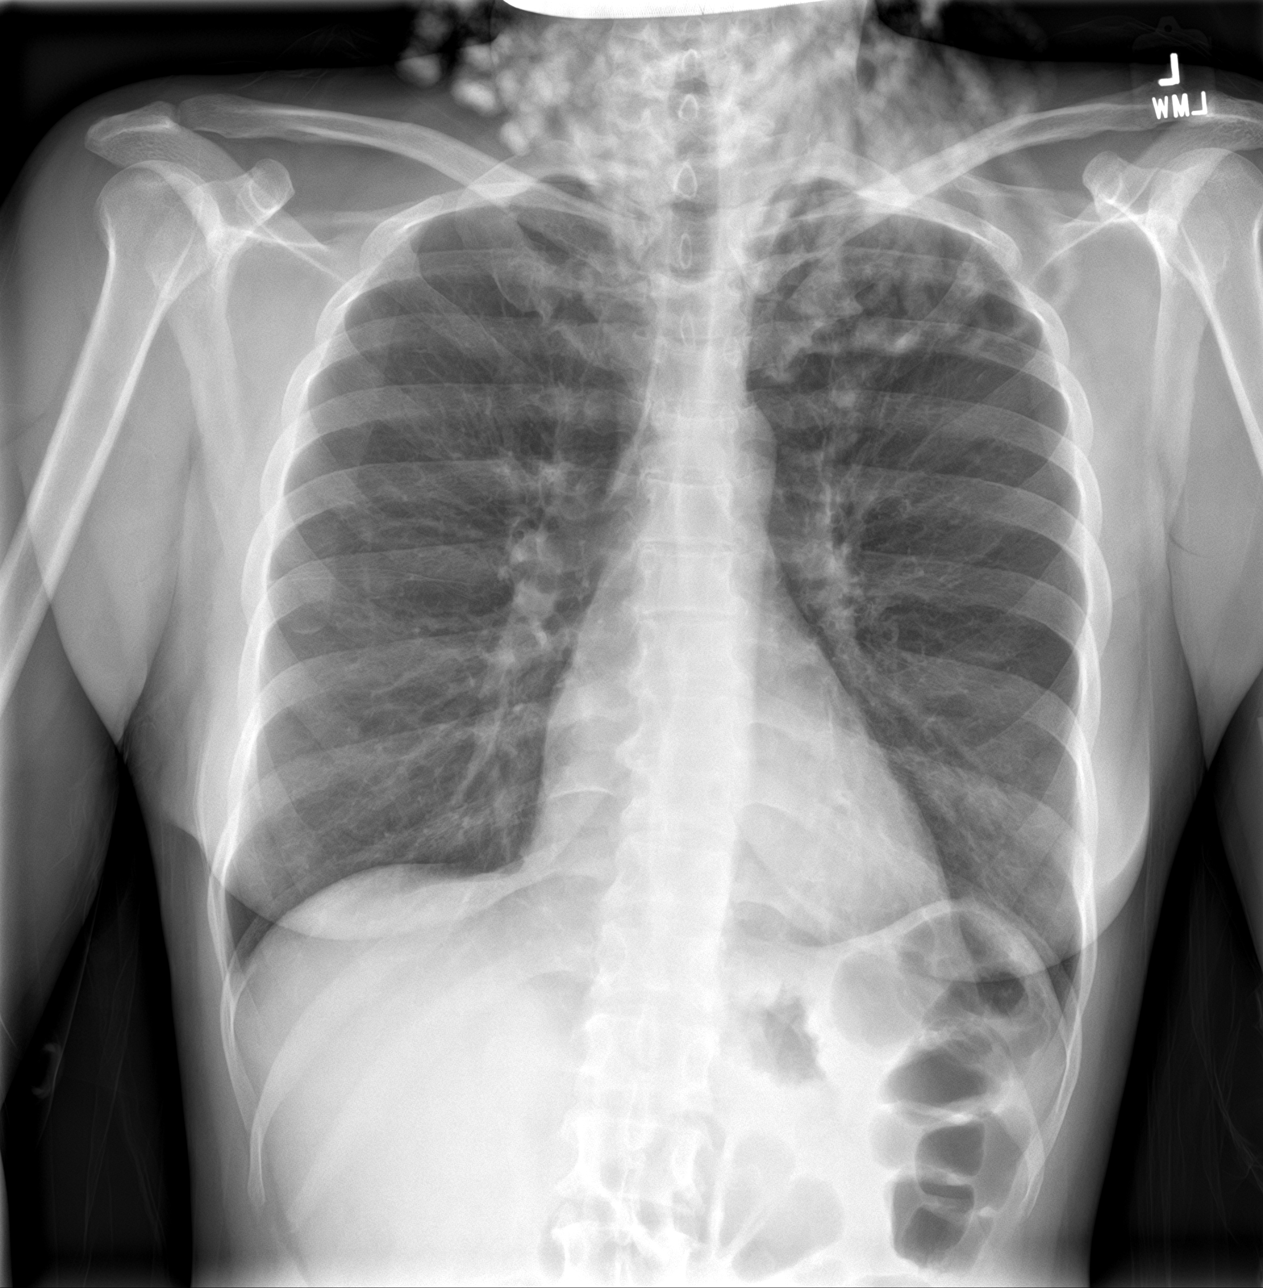

[abdomen erect]
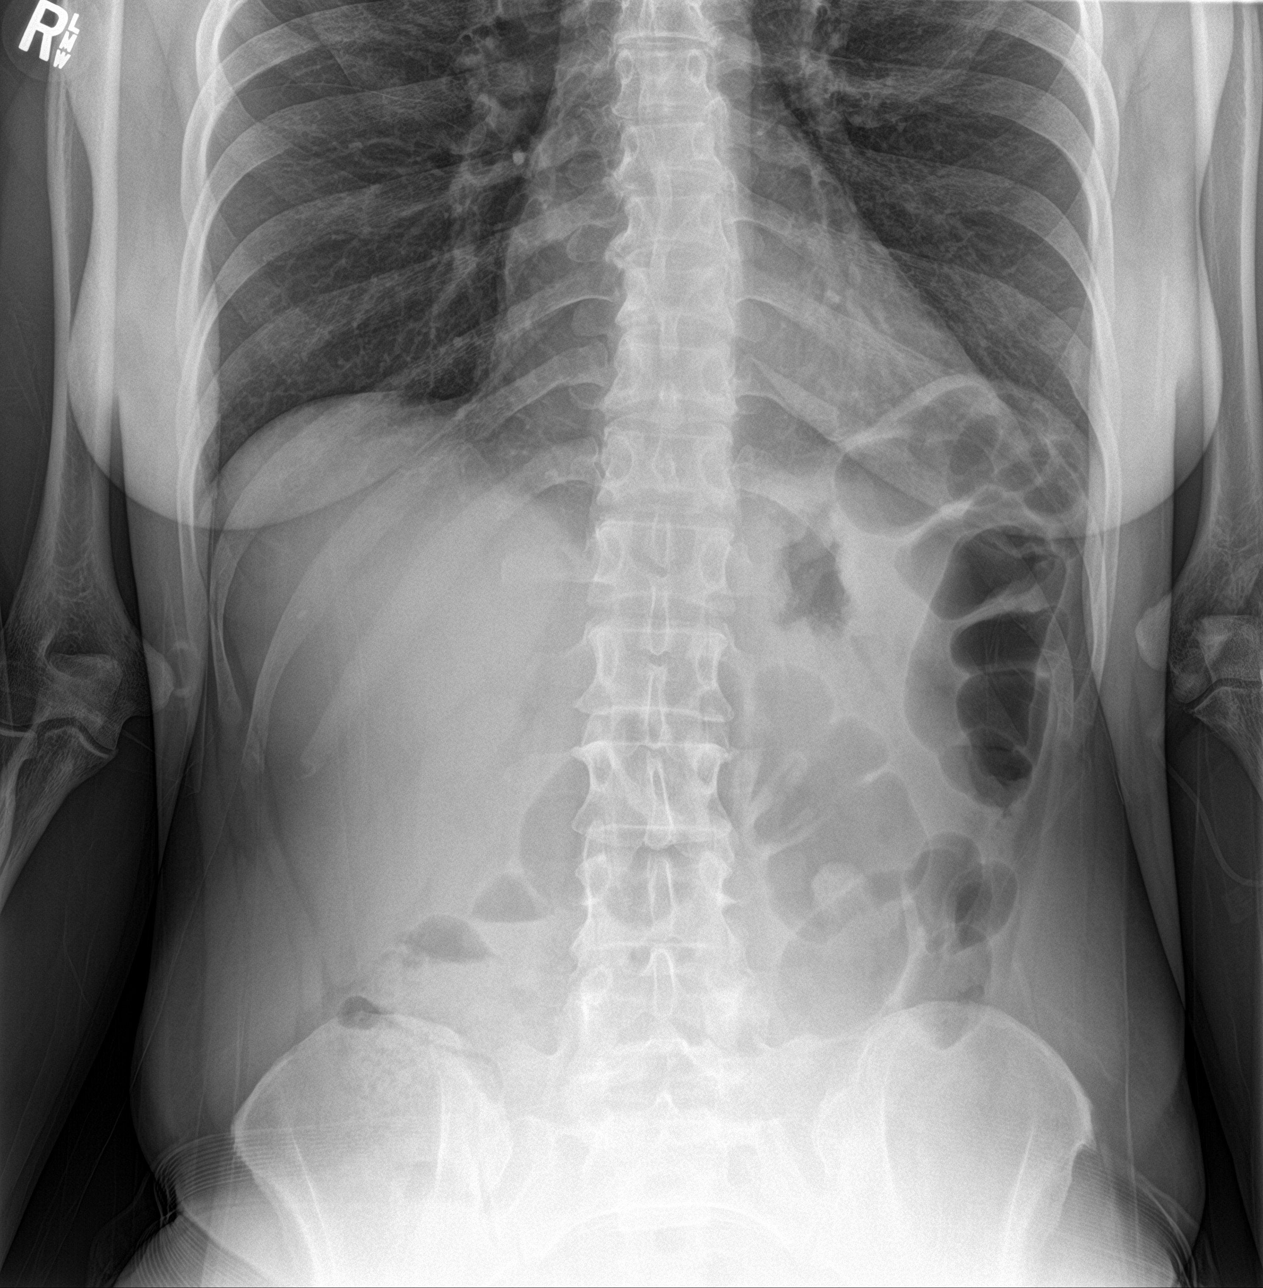

[abdomen supine]
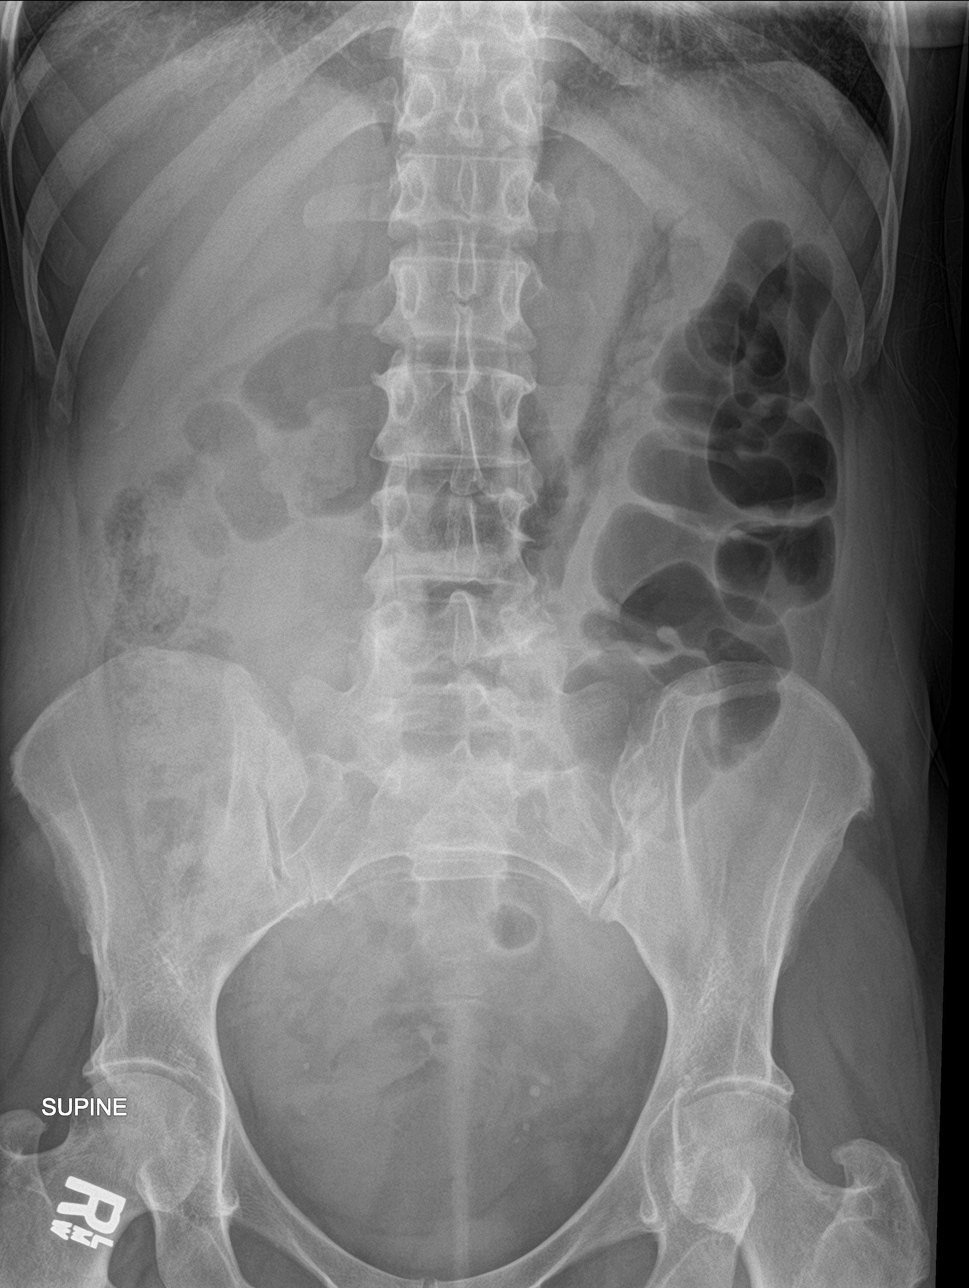

[3 of 3 positions shown; findings below may reference images not displayed]

FINDINGS: Pulmonary hyperinflation. Heart size and pulmonary vascularity are
normal. Lungs are clear and expanded.

Gas and stool scattered throughout the colon. No small or large
bowel distention. No free intra-abdominal air. No abnormal air-fluid
levels. No radiopaque stones. Visualized bones and soft tissue
contours are intact. Calcified phleboliths in the pelvis.
IMPRESSION: No evidence of active pulmonary disease. Nonobstructive bowel gas
pattern.

## 2020-04-13 ENCOUNTER — Telehealth: Payer: Self-pay

## 2020-04-13 NOTE — Telephone Encounter (Signed)
LVM for pt to schedule NP appt

## 2020-05-04 ENCOUNTER — Encounter: Payer: Self-pay | Admitting: Podiatry

## 2020-05-04 ENCOUNTER — Ambulatory Visit (INDEPENDENT_AMBULATORY_CARE_PROVIDER_SITE_OTHER): Payer: BLUE CROSS/BLUE SHIELD | Admitting: Podiatry

## 2020-05-04 ENCOUNTER — Other Ambulatory Visit: Payer: Self-pay

## 2020-05-04 DIAGNOSIS — B353 Tinea pedis: Secondary | ICD-10-CM

## 2020-05-04 DIAGNOSIS — B351 Tinea unguium: Secondary | ICD-10-CM | POA: Diagnosis not present

## 2020-05-04 DIAGNOSIS — L84 Corns and callosities: Secondary | ICD-10-CM

## 2020-05-04 DIAGNOSIS — M79672 Pain in left foot: Secondary | ICD-10-CM

## 2020-05-04 MED ORDER — TERBINAFINE HCL 250 MG PO TABS: 250.0000 mg | ORAL_TABLET | Freq: Every day | ORAL | 0 refills | Status: DC

## 2020-05-04 NOTE — Progress Notes (Signed)
  Subjective:  Patient ID: Kim Howard, female    DOB: 1971-02-23,  MRN: [id number]  Chief Complaint  Patient presents with  . Nail Problem    Patient presents today for bilat feet burning, itching, stinging and peeling bottom of feet and between toes, nail dystrophy bilat hallux and painful callous under left forefoot  . Tinea Pedis    49 y.o. female presents with the above complaint. History confirmed with patient. Nail have become discolored and black. Hx of trauma to nail plates years ago  Objective:  Physical Exam: warm, good capillary refill, no trophic changes or ulcerative lesions, normal DP and PT pulses and normal sensory exam. B/L hallux discolored with thickening and subungual debris. Tinea pedis in moccasin distribution      Assessment:   1. Onychomycosis   2. Tinea pedis of both feet   3. Callus of foot   4. Pain in left foot      Plan:  Patient was evaluated and treated and all questions answered.  Discussed the etiology and treatment options for tinea pedis.  Discussed topical and oral treatment.  Also discussed appropriate foot hygiene, use of antifungal spray such as Tinactin in shoes, as well as cleaning her foot surfaces such as showers and bathroom floors with bleach.  All symptomatic hyperkeratoses were safely debrided with a sterile #15 blade to patient's level of comfort without incident. We discussed preventative and palliative care of these lesions including supportive and accommodative shoegear, padding, prefabricated and custom molded accommodative orthoses, use of a pumice stone and lotions/creams daily.  Discussed the etiology and treatment options for the condition in detail with the patient. I recommended oral treatment with terbinafine x90 days. No hx of liver disease or alcohol abuse. This will also address the tinea pedis.  Return in about 4 months (around 09/01/2020).

## 2020-05-04 NOTE — Patient Instructions (Signed)
Use antifungal spray such as Tinactin in shoes 1-2x/weekly, clean surfaces such as showers and bathroom floors that you are barefoot with bleach.   Look for urea 40% cream or ointment and apply to the thickened dry skin / calluses. This can be bought over the counter, at a pharmacy or online such as [employer].

## 2020-09-14 ENCOUNTER — Ambulatory Visit: Payer: BLUE CROSS/BLUE SHIELD | Admitting: Podiatry

## 2020-12-19 ENCOUNTER — Emergency Department: Payer: Medicaid Other

## 2020-12-19 ENCOUNTER — Emergency Department
Admission: EM | Admit: 2020-12-19 | Discharge: 2020-12-19 | Disposition: A | Discharge status: Home / Self Care | Payer: Medicaid Other | Attending: Emergency Medicine | Admitting: Emergency Medicine

## 2020-12-19 ENCOUNTER — Encounter: Payer: Self-pay | Admitting: Radiology

## 2020-12-19 ENCOUNTER — Other Ambulatory Visit: Payer: Self-pay

## 2020-12-19 DIAGNOSIS — K2900 Acute gastritis without bleeding: Secondary | ICD-10-CM | POA: Insufficient documentation

## 2020-12-19 DIAGNOSIS — R1010 Upper abdominal pain, unspecified: Secondary | ICD-10-CM

## 2020-12-19 DIAGNOSIS — R1013 Epigastric pain: Secondary | ICD-10-CM | POA: Diagnosis present

## 2020-12-19 DIAGNOSIS — F17210 Nicotine dependence, cigarettes, uncomplicated: Secondary | ICD-10-CM | POA: Insufficient documentation

## 2020-12-19 LAB — CBC
HCT: 37.8 % (ref 36.0–46.0)
Hemoglobin: 13.1 g/dL (ref 12.0–15.0)
MCH: 32.8 pg (ref 26.0–34.0)
MCHC: 34.7 g/dL (ref 30.0–36.0)
MCV: 94.5 fL (ref 80.0–100.0)
Platelets: 256 10*3/uL (ref 150–400)
RBC: 4 MIL/uL (ref 3.87–5.11)
RDW: 11.9 % (ref 11.5–15.5)
WBC: 10.2 10*3/uL (ref 4.0–10.5)
nRBC: 0 % (ref 0.0–0.2)

## 2020-12-19 LAB — COMPREHENSIVE METABOLIC PANEL
ALT: 11 U/L (ref 0–44)
AST: 18 U/L (ref 15–41)
Albumin: 3.9 g/dL (ref 3.5–5.0)
Alkaline Phosphatase: 64 U/L (ref 38–126)
Anion gap: 6 (ref 5–15)
BUN: 7 mg/dL (ref 6–20)
CO2: 24 mmol/L (ref 22–32)
Calcium: 9.1 mg/dL (ref 8.9–10.3)
Chloride: 108 mmol/L (ref 98–111)
Creatinine, Ser: 0.76 mg/dL (ref 0.44–1.00)
GFR, Estimated: >60 mL/min (ref >60)
Glucose, Bld: 105 mg/dL — ABNORMAL HIGH (ref 70–99)
Potassium: 3.7 mmol/L (ref 3.5–5.1)
Sodium: 138 mmol/L (ref 135–145)
Total Bilirubin: 0.6 mg/dL (ref 0.3–1.2)
Total Protein: 7.1 g/dL (ref 6.5–8.1)

## 2020-12-19 LAB — TROPONIN I (HIGH SENSITIVITY): Troponin I (High Sensitivity): 4 ng/L (ref <18)

## 2020-12-19 LAB — LIPASE, BLOOD: Lipase: 28 U/L (ref 11–51)

## 2020-12-19 MED ORDER — PANTOPRAZOLE SODIUM 40 MG IV SOLR
40.0000 mg | Freq: Once | INTRAVENOUS | Status: AC
  Administered 2020-12-19: 40 mg via INTRAVENOUS
  Filled 2020-12-19: qty 40

## 2020-12-19 MED ORDER — PROMETHAZINE HCL 25 MG RE SUPP: 25.0000 mg | Freq: Three times a day (TID) | RECTAL | 0 refills | Status: DC | PRN

## 2020-12-19 MED ORDER — DROPERIDOL 2.5 MG/ML IJ SOLN
2.5000 mg | Freq: Once | INTRAMUSCULAR | Status: AC
  Administered 2020-12-19: 2.5 mg via INTRAVENOUS
  Filled 2020-12-19: qty 2

## 2020-12-19 MED ORDER — ONDANSETRON 4 MG PO TBDP: 4.0000 mg | ORAL_TABLET | Freq: Three times a day (TID) | ORAL | 0 refills | Status: DC | PRN

## 2020-12-19 MED ORDER — LACTATED RINGERS IV BOLUS
1000.0000 mL | Freq: Once | INTRAVENOUS | Status: AC
  Administered 2020-12-19: 1000 mL via INTRAVENOUS

## 2020-12-19 MED ORDER — OMEPRAZOLE 40 MG PO CPDR: 40.0000 mg | DELAYED_RELEASE_CAPSULE | Freq: Every day | ORAL | 0 refills | Status: DC

## 2020-12-19 NOTE — ED Provider Notes (Signed)
[facility] Emergency Department Provider Note  ____________________________________________   Event Date/Time   First MD Initiated Contact with Patient 12/19/20 2046     (approximate)  I have reviewed the triage vital signs and the nursing notes.   HISTORY  Chief Complaint Abdominal Pain    HPI Kim Howard is a 50 y.o. female here with 3 days of abdominal pain.  The patient states that over the last 3 days, she has had constant, worsening, aching and gnawing epigastric abdominal pain.  She has had associated profuse vomiting along with inability to eat or drink anything.  She has a history of recurrent gastritis and abdominal pain, and has been told she had an ulcer before.  She has been out of her Protonix for the last week or so.  Denies any blood in her bowels.  No fevers.  Symptoms feel similar to her prior gastritis episodes.    Past Medical History:  Diagnosis Date   Depression    GERD (gastroesophageal reflux disease)    Helicobacter pylori gastritis    Irritable bowel syndrome (IBS)    Tobacco use     Patient Active Problem List   Diagnosis Date Noted   Pneumonitis 05/31/2017   H. pylori infection 05/14/2017    Past Surgical History:  Procedure Laterality Date   ESOPHAGOGASTRODUODENOSCOPY (EGD) WITH PROPOFOL N/A 01/08/2018   Procedure: ESOPHAGOGASTRODUODENOSCOPY (EGD) WITH PROPOFOL;  Surgeon: Scot Jun, MD;  Location: [facility] ENDOSCOPY;  Service: Endoscopy;  Laterality: N/A;   INCISE AND DRAIN ABCESS      Prior to Admission medications   Medication Sig Start Date End Date Taking? Authorizing Provider  ondansetron (ZOFRAN ODT) 4 MG disintegrating tablet Take 1 tablet (4 mg total) by mouth every 8 (eight) hours as needed for nausea or vomiting. 12/19/20  Yes Shaune Pollack, MD  promethazine (PHENERGAN) 25 MG suppository Place 1 suppository (25 mg total) rectally every 8 (eight) hours as needed for refractory nausea / vomiting.  12/19/20  Yes Shaune Pollack, MD  albuterol (PROVENTIL HFA;VENTOLIN HFA) 108 (90 Base) MCG/ACT inhaler Inhale 2 puffs into the lungs every 6 (six) hours as needed for wheezing or shortness of breath. 11/30/17   Dionne Bucy, MD  albuterol (PROVENTIL) (2.5 MG/3ML) 0.083% nebulizer solution Take 3 mLs (2.5 mg total) by nebulization every 6 (six) hours as needed for wheezing or shortness of breath. 11/30/17   Dionne Bucy, MD  bisacodyl (DULCOLAX) 5 MG EC tablet Take by mouth.    [provider]  CYCLOBENZAPRINE HCL ER PO Take by mouth daily.    [provider]  dicyclomine (BENTYL) 10 MG capsule Take 10 mg by mouth 4 (four) times daily.    [provider]  doxycycline (VIBRA-TABS) 100 MG tablet Take 100 mg by mouth 2 (two) times daily. 04/04/20   [provider]  etonogestrel (NEXPLANON) 68 MG IMPL implant 1 each by Subdermal route once.    [provider]  famotidine (PEPCID) 40 MG tablet Take by mouth. 02/28/17   [provider]  gabapentin (NEURONTIN) 100 MG capsule Take 100 mg by mouth 2 (two) times daily.    [provider]  ibuprofen (ADVIL,MOTRIN) 600 MG tablet Take 1 tablet (600 mg total) by mouth every 6 (six) hours as needed. Patient not taking: Reported on 01/08/2018 11/30/17   Dionne Bucy, MD  lurasidone (LATUDA) 40 MG TABS tablet Take 40 mg by mouth daily.    [provider]  mirtazapine (REMERON) 15 MG  tablet Take 15 mg by mouth at bedtime. 05/02/20   [provider]  omeprazole (PRILOSEC) 40 MG capsule Take 1 capsule (40 mg total) by mouth daily. 12/19/20 01/18/21  Shaune Pollack, MD  ondansetron (ZOFRAN) 4 MG tablet Take by mouth.    [provider]  QUEtiapine (SEROQUEL) 100 MG tablet Take 100 mg by mouth 2 (two) times daily.    [provider]  sucralfate (CARAFATE) 1 GM/10ML suspension Take 10 mLs (1 g total) by mouth 4 (four) times daily. 12/20/17   Irean Hong, MD   terbinafine (LAMISIL) 250 MG tablet Take 1 tablet (250 mg total) by mouth daily. 05/04/20   Edwin Cap, DPM    Allergies Penicillins  Family History  Problem Relation Age of Onset   Prostate cancer Father     Social History Social History   Tobacco Use   Smoking status: Every Day    Packs/day: 1.00    Pack years: 0.00    Types: Cigarettes   Smokeless tobacco: Never  Vaping Use   Vaping Use: Never used  Substance Use Topics   Alcohol use: No   Drug use: Yes    Types: Marijuana    Review of Systems  Review of Systems  Constitutional:  Positive for fatigue. Negative for fever.  HENT:  Negative for congestion and sore throat.   Eyes:  Negative for visual disturbance.  Respiratory:  Negative for cough and shortness of breath.   Cardiovascular:  Negative for chest pain.  Gastrointestinal:  Positive for abdominal pain, nausea and vomiting. Negative for diarrhea.  Genitourinary:  Negative for flank pain.  Musculoskeletal:  Negative for back pain and neck pain.  Skin:  Negative for rash and wound.  Neurological:  Negative for weakness.    ____________________________________________  PHYSICAL EXAM:      VITAL SIGNS: ED Triage Vitals  Enc Vitals Group     BP 12/19/20 1943 (!) 141/110     Pulse Rate 12/19/20 1943 73     Resp 12/19/20 1943 18     Temp 12/19/20 1943 98.2 F (36.8 C)     Temp Source 12/19/20 1943 Oral     SpO2 12/19/20 1943 100 %     Weight 12/19/20 1943 148 lb (67.1 kg)     Height 12/19/20 1943 5\' 7"  (1.702 m)     Head Circumference --      Peak Flow --      Pain Score 12/19/20 1948 10     Pain Loc --      Pain Edu? --      Excl. in GC? --      Physical Exam Vitals and nursing note reviewed.  Constitutional:      General: She is not in acute distress.    Appearance: She is well-developed.  HENT:     Head: Normocephalic and atraumatic.  Eyes:     Conjunctiva/sclera: Conjunctivae normal.  Cardiovascular:     Rate and Rhythm: Normal  rate and regular rhythm.     Heart sounds: Normal heart sounds. No murmur heard.   No friction rub.  Pulmonary:     Effort: Pulmonary effort is normal. No respiratory distress.     Breath sounds: Normal breath sounds. No wheezing or rales.  Abdominal:     General: There is no distension.     Palpations: Abdomen is soft.     Tenderness: There is abdominal tenderness in the epigastric area. There is no guarding or rebound.  Musculoskeletal:     Cervical back: Neck supple.  Skin:    General: Skin is warm.     Capillary Refill: Capillary refill takes less than 2 seconds.  Neurological:     Mental Status: She is alert and oriented to person, place, and time.     Motor: No abnormal muscle tone.      ____________________________________________   LABS (all labs ordered are listed, but only abnormal results are displayed)  Labs Reviewed  COMPREHENSIVE METABOLIC PANEL - Abnormal; Notable for the following components:      Result Value   Glucose, Bld 105 (*)    All other components within normal limits  CBC  LIPASE, BLOOD  URINALYSIS, COMPLETE (UACMP) WITH MICROSCOPIC  POC URINE PREG, ED  TROPONIN I (HIGH SENSITIVITY)  TROPONIN I (HIGH SENSITIVITY)    ____________________________________________  EKG: Normal sinus rhythm, ventricular rate 82.  PR 152, QRS 72, QTc 427.  Left axis deviation.  No acute ST elevations or depressions. ________________________________________  RADIOLOGY All imaging, including plain films, CT scans, and ultrasounds, independently reviewed by me, and interpretations confirmed via formal radiology reads.  ED MD interpretation:   Ultrasound right upper quadrant: Unremarkable  Official radiology report(s): US Abdomen Limited RUQ (LIVER/GB)  Result Date: 12/19/2020 CLINICAL DATA:  50 year old female with upper abdominal pain. EXAM: ULTRASOUND ABDOMEN LIMITED RIGHT UPPER QUADRANT COMPARISON:  Right upper quadrant ultrasound dated 12/20/2017. FINDINGS:  Gallbladder: No gallstones or wall thickening visualized. No sonographic Murphy sign noted by sonographer. Common bile duct: Diameter: 3 mm Liver: No focal lesion identified. Within normal limits in parenchymal echogenicity. Portal vein is patent on color Doppler imaging with normal direction of blood flow towards the liver. Other: None. IMPRESSION: Unremarkable right upper quadrant ultrasound. Electronically Signed   By: Elgie CollardArash  Radparvar M.D.   On: 12/19/2020 21:12    ____________________________________________  PROCEDURES   Procedure(s) performed (including Critical Care):  Procedures  ____________________________________________  INITIAL IMPRESSION / MDM / ASSESSMENT AND PLAN / ED COURSE  As part of my medical decision making, I reviewed the following data within the electronic MEDICAL RECORD NUMBER Nursing notes reviewed and incorporated, Old chart reviewed, Notes from prior ED visits, and [place] Controlled Substance Database       *Meade Mawawani A Rueth was evaluated in Emergency Department on 12/19/2020 for the symptoms described in the history of present illness. She was evaluated in the context of the global COVID-19 pandemic, which necessitated consideration that the patient might be at risk for infection with the SARS-CoV-2 virus that causes COVID-19. Institutional protocols and algorithms that pertain to the evaluation of patients at risk for COVID-19 are in a state of rapid change based on information released by regulatory bodies including the CDC and federal and state organizations. These policies and algorithms were followed during the patient's care in the ED.  Some ED evaluations and interventions may be delayed as a result of limited staffing during the pandemic.*     Medical Decision Making: 50 year old female here with ongoing epigastric abdominal pain, nausea, vomiting.  She has a well-documented history of the same.  Patient given Protonix as well as droperidol with excellent effect  and improvement.  She is tolerating p.o.  Lab work reviewed, is overall very reassuring.  She has a normal white blood cell count.  CMP shows normal LFTs and renal function.  Lipase is normal.  EKG nonischemic and troponin negative, do not suspect referred cardiac pain.  Ultrasound of the right upper quadrant is negative.  Given  her history of the same, suspect recurrent gastritis.  Differential includes cyclical vomiting or gastroparesis.  Will give refill of her antacids, antiemetics, and refer for GI follow-up.  ____________________________________________  FINAL CLINICAL IMPRESSION(S) / ED DIAGNOSES  Final diagnoses:  Upper abdominal pain  Acute superficial gastritis without hemorrhage     MEDICATIONS GIVEN DURING THIS VISIT:  Medications  pantoprazole (PROTONIX) injection 40 mg (40 mg Intravenous Given 12/19/20 2123)  droperidol (INAPSINE) 2.5 MG/ML injection 2.5 mg (2.5 mg Intravenous Given 12/19/20 2119)  lactated ringers bolus 1,000 mL (1,000 mLs Intravenous New Bag/Given 12/19/20 2126)     ED Discharge Orders          Ordered    omeprazole (PRILOSEC) 40 MG capsule  Daily        12/19/20 2224    ondansetron (ZOFRAN ODT) 4 MG disintegrating tablet  Every 8 hours PRN        12/19/20 2224    promethazine (PHENERGAN) 25 MG suppository  Every 8 hours PRN        12/19/20 2224             Note:  This document was prepared using Dragon voice recognition software and may include unintentional dictation errors.   Shaune Pollack, MD 12/19/20 2225

## 2020-12-19 NOTE — ED Provider Notes (Signed)
Emergency Medicine Provider Triage Evaluation Note  Kim Howard , a 50 y.o. female  was evaluated in triage.  Pt complains of upper abdominal pain  Review of Systems  Positive: Mild nausea Negative: No fevers or chills  Physical Exam  BP (!) 141/110 (BP Location: Right Arm)   Pulse 73   Temp 98.2 F (36.8 C) (Oral)   Resp 18   Ht 1.702 m (5\' 7" )   Wt 67.1 kg   SpO2 100%   BMI 23.18 kg/m  Gen:   Awake, no distress   Resp:  Normal effort  MSK:   Moves extremities without difficulty  Other:  Abdomen: Tenderness in the epigastrium and right upper quadrant  Medical Decision Making  Medically screening exam initiated at 8:22 PM.  Appropriate orders placed.  was informed that the remainder of the evaluation will be completed by another provider, this initial triage assessment does not replace that evaluation, and the importance of remaining in the ED until their evaluation is complete.  Labs pending, will order ultrasound of the right upper quadrant   Meade Maw, MD 12/19/20 2022

## 2020-12-19 NOTE — ED Notes (Signed)
Pt verbalized pain is "much better than earlier".

## 2020-12-19 NOTE — ED Notes (Signed)
See triage note. Pt tender at abdomen. States history of ulcers and H Pylori.

## 2020-12-19 NOTE — ED Triage Notes (Signed)
Pt in with co epigastric pain x 3 days with vomiting. States hx of h pylori but ran our of prilosec a few days ago.

## 2022-05-20 IMAGING — US US ABDOMEN LIMITED
1 series · 14 of 25 positions shown · non-contrast
Comparison: Right upper quadrant ultrasound dated 12/20/2017.

CLINICAL DATA: 49-year-old female with upper abdominal pain.

EXAM:
ULTRASOUND ABDOMEN LIMITED RIGHT UPPER QUADRANT

[Series 1: us abdomen limited ruq (liver/gb) · 14 of 43 slices shown]
[im 1/43]
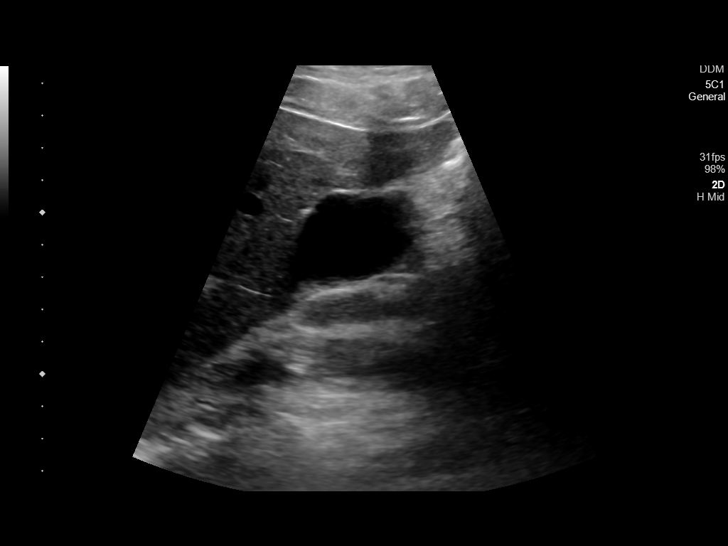
[im 4/43]
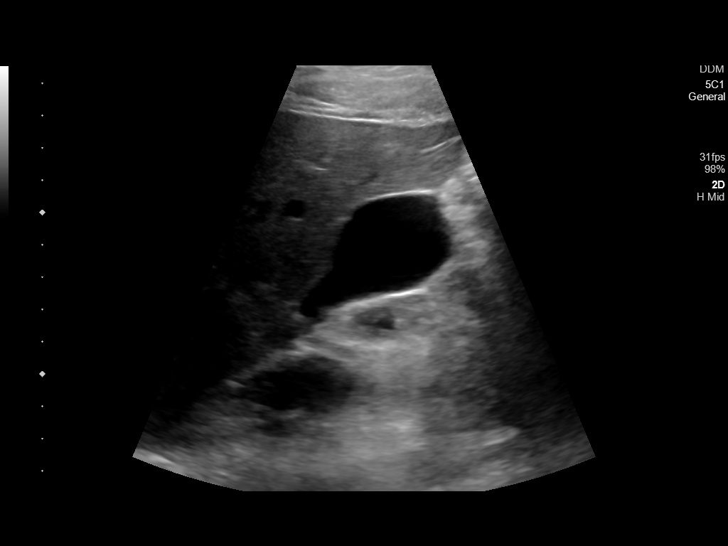
[im 8/43]
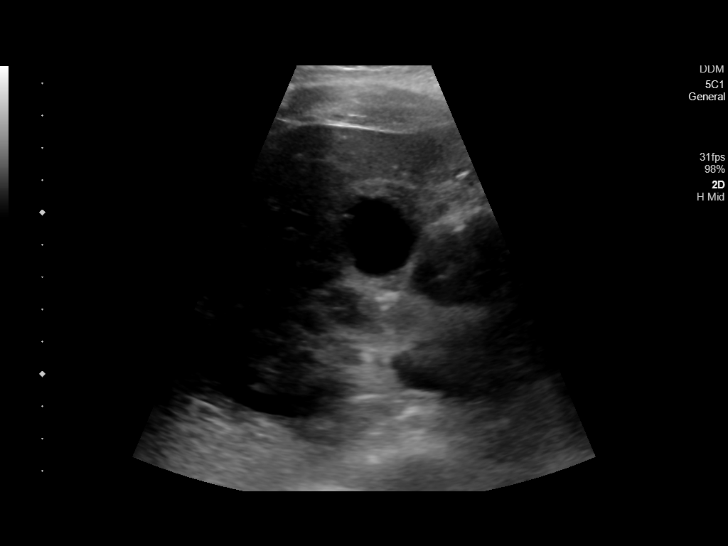
[im 11/43]
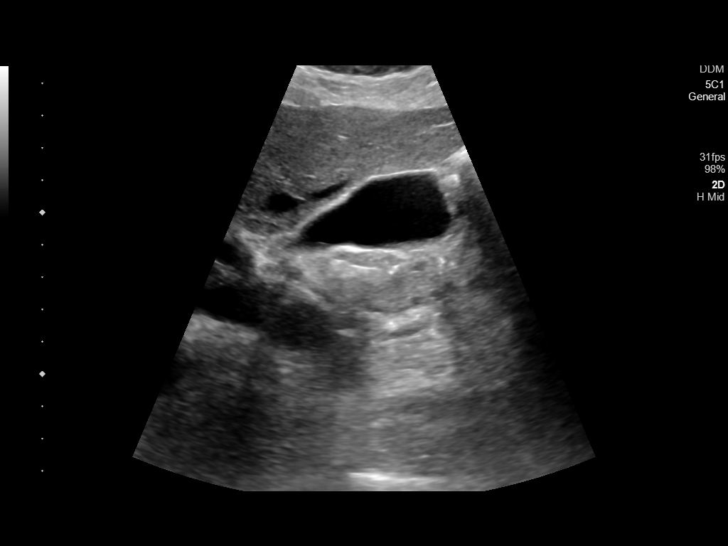
[im 15/43]
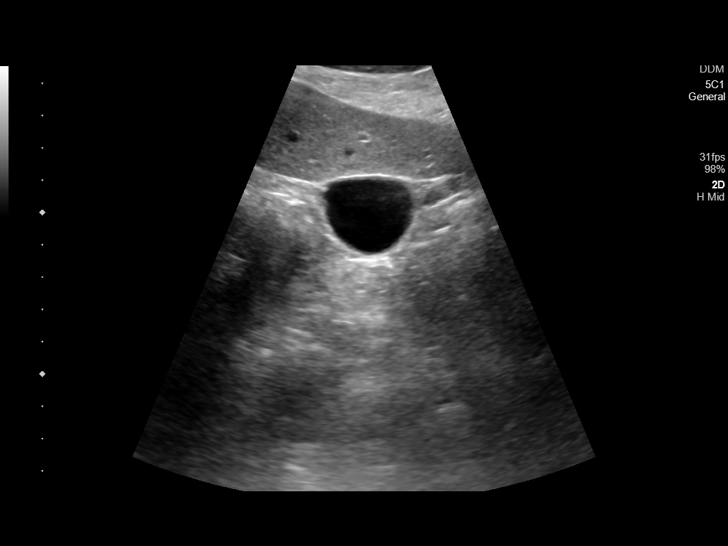
[im 16/43]
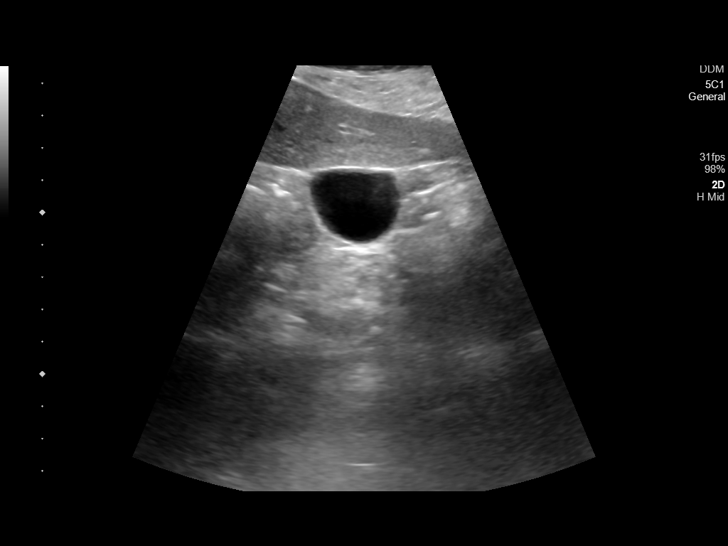
[im 20/43]
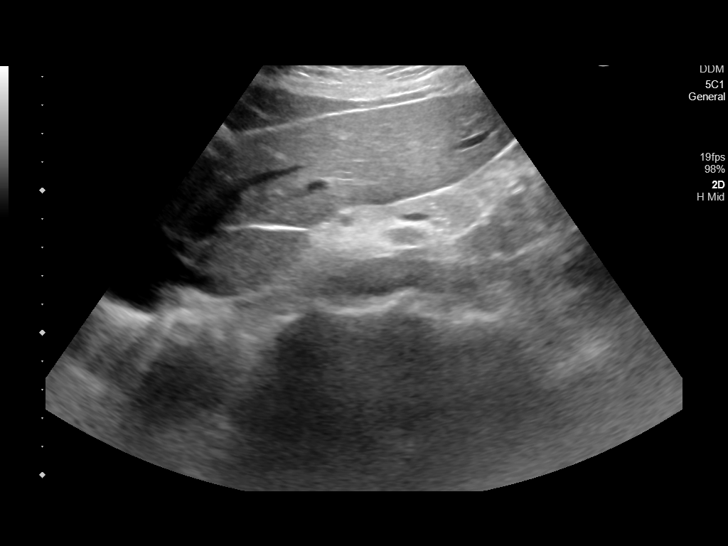
[im 23/43]
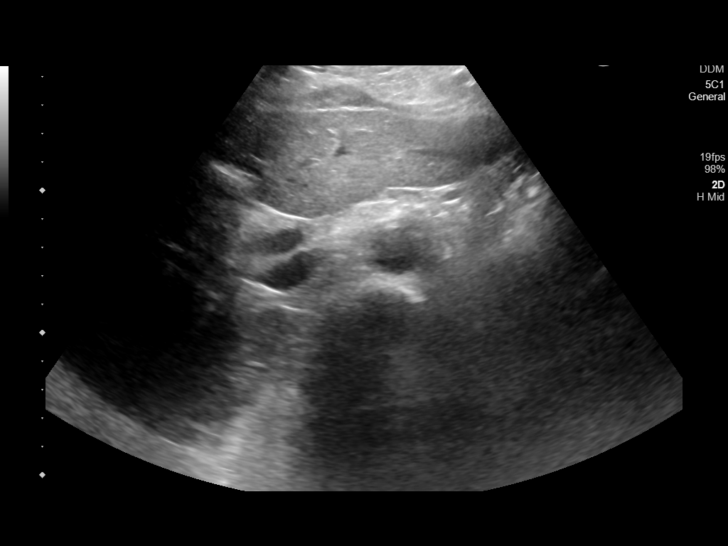
[im 27/43]
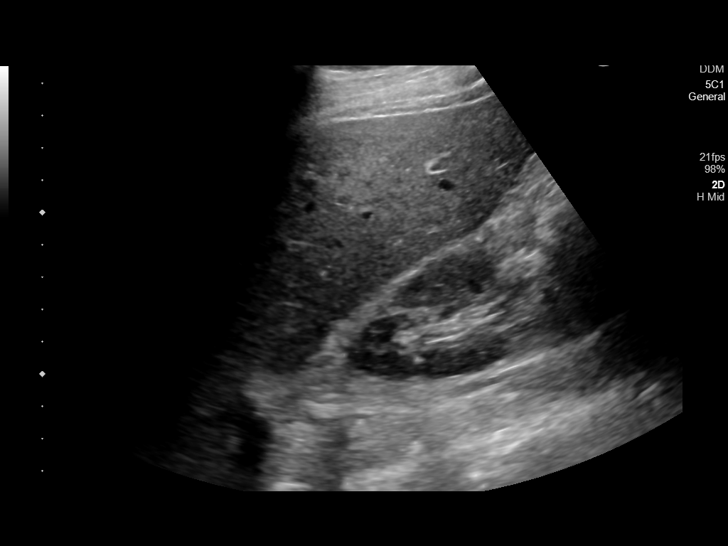
[im 29/43]
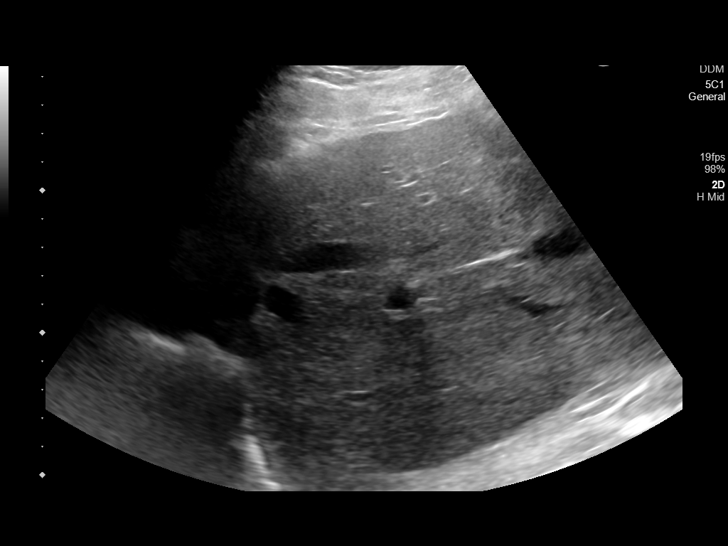
[im 32/43]
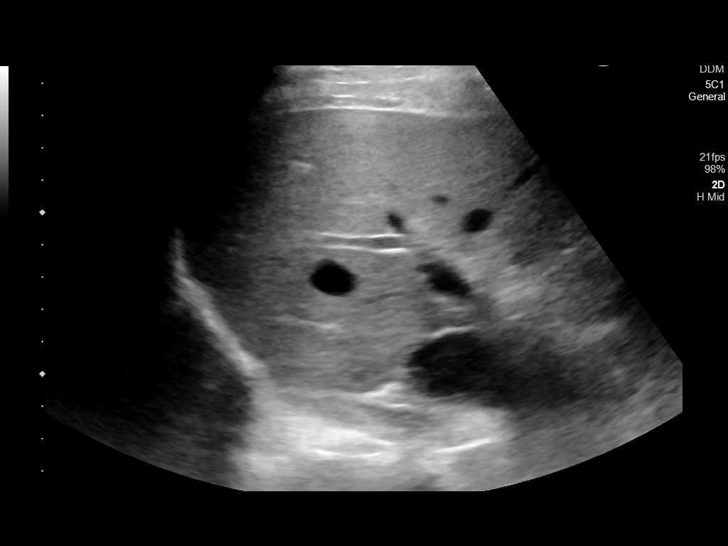
[im 36/43]
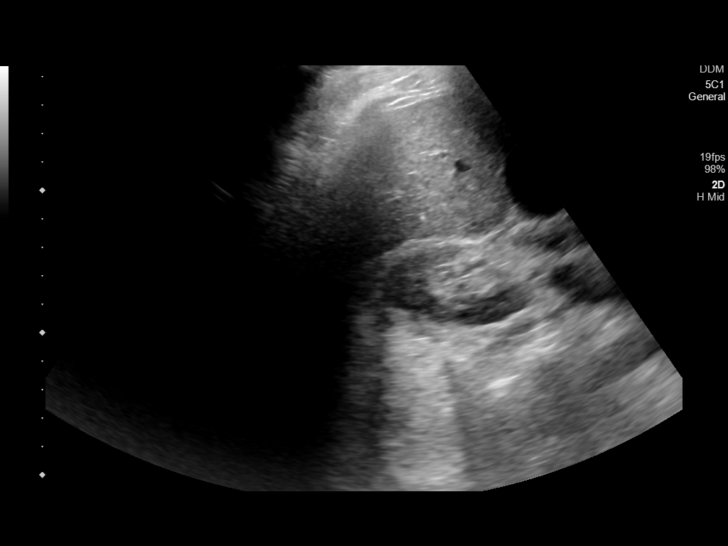
[im 39/43]
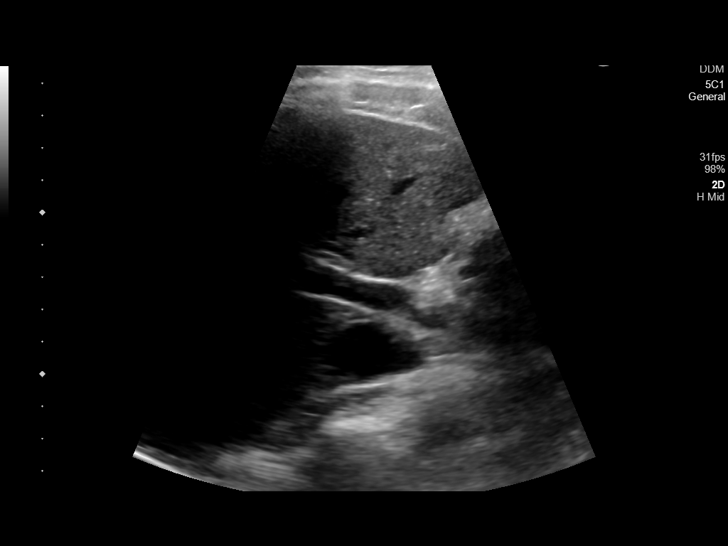
[im 43/43]
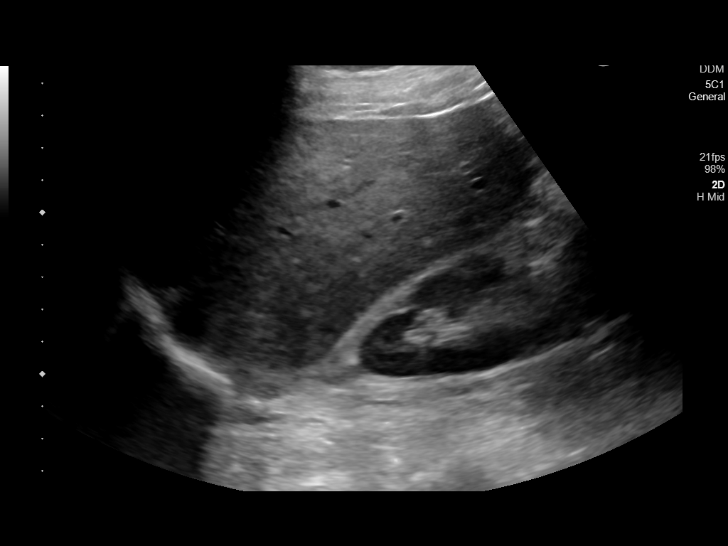

[14 of 25 positions shown; findings below may reference images not displayed]

FINDINGS: Gallbladder:

No gallstones or wall thickening visualized. No sonographic Murphy
sign noted by sonographer.

Common bile duct:

Diameter: 3 mm

Liver:

No focal lesion identified. Within normal limits in parenchymal
echogenicity. Portal vein is patent on color Doppler imaging with
normal direction of blood flow towards the liver.

Other: None.
IMPRESSION: Unremarkable right upper quadrant ultrasound.

## 2022-06-11 ENCOUNTER — Inpatient Hospital Stay
Admission: EM | Admit: 2022-06-11 | Discharge: 2022-06-15 | DRG: 193 | Disposition: A | Discharge status: Home / Self Care | Payer: Medicaid Other | Attending: Internal Medicine | Admitting: Internal Medicine

## 2022-06-11 ENCOUNTER — Other Ambulatory Visit: Payer: Self-pay

## 2022-06-11 ENCOUNTER — Emergency Department: Payer: Medicaid Other

## 2022-06-11 DIAGNOSIS — F17200 Nicotine dependence, unspecified, uncomplicated: Secondary | ICD-10-CM | POA: Insufficient documentation

## 2022-06-11 DIAGNOSIS — E872 Acidosis, unspecified: Secondary | ICD-10-CM | POA: Diagnosis not present

## 2022-06-11 DIAGNOSIS — K529 Noninfective gastroenteritis and colitis, unspecified: Secondary | ICD-10-CM | POA: Diagnosis present

## 2022-06-11 DIAGNOSIS — Z8042 Family history of malignant neoplasm of prostate: Secondary | ICD-10-CM

## 2022-06-11 DIAGNOSIS — F32A Depression, unspecified: Secondary | ICD-10-CM | POA: Diagnosis present

## 2022-06-11 DIAGNOSIS — F17210 Nicotine dependence, cigarettes, uncomplicated: Secondary | ICD-10-CM | POA: Diagnosis present

## 2022-06-11 DIAGNOSIS — R9431 Abnormal electrocardiogram [ECG] [EKG]: Secondary | ICD-10-CM | POA: Diagnosis not present

## 2022-06-11 DIAGNOSIS — J101 Influenza due to other identified influenza virus with other respiratory manifestations: Secondary | ICD-10-CM | POA: Diagnosis present

## 2022-06-11 DIAGNOSIS — K219 Gastro-esophageal reflux disease without esophagitis: Secondary | ICD-10-CM | POA: Diagnosis present

## 2022-06-11 DIAGNOSIS — J441 Chronic obstructive pulmonary disease with (acute) exacerbation: Secondary | ICD-10-CM | POA: Diagnosis present

## 2022-06-11 DIAGNOSIS — Z880 Allergy status to penicillin: Secondary | ICD-10-CM | POA: Diagnosis not present

## 2022-06-11 DIAGNOSIS — Z1152 Encounter for screening for COVID-19: Secondary | ICD-10-CM

## 2022-06-11 DIAGNOSIS — J9601 Acute respiratory failure with hypoxia: Secondary | ICD-10-CM | POA: Diagnosis present

## 2022-06-11 DIAGNOSIS — R197 Diarrhea, unspecified: Secondary | ICD-10-CM | POA: Insufficient documentation

## 2022-06-11 DIAGNOSIS — R0789 Other chest pain: Secondary | ICD-10-CM | POA: Insufficient documentation

## 2022-06-11 DIAGNOSIS — R059 Cough, unspecified: Secondary | ICD-10-CM | POA: Insufficient documentation

## 2022-06-11 DIAGNOSIS — Z79899 Other long term (current) drug therapy: Secondary | ICD-10-CM | POA: Diagnosis not present

## 2022-06-11 DIAGNOSIS — R0602 Shortness of breath: Secondary | ICD-10-CM | POA: Insufficient documentation

## 2022-06-11 DIAGNOSIS — R0902 Hypoxemia: Secondary | ICD-10-CM

## 2022-06-11 LAB — CBC WITH DIFFERENTIAL/PLATELET
Abs Immature Granulocytes: 0.02 10*3/uL (ref 0.00–0.07)
Basophils Absolute: 0 10*3/uL (ref 0.0–0.1)
Basophils Relative: 1 %
Eosinophils Absolute: 0.1 10*3/uL (ref 0.0–0.5)
Eosinophils Relative: 2 %
HCT: 46.1 % — ABNORMAL HIGH (ref 36.0–46.0)
Hemoglobin: 15.1 g/dL — ABNORMAL HIGH (ref 12.0–15.0)
Immature Granulocytes: 0 %
Lymphocytes Relative: 33 %
Lymphs Abs: 2 10*3/uL (ref 0.7–4.0)
MCH: 30.4 pg (ref 26.0–34.0)
MCHC: 32.8 g/dL (ref 30.0–36.0)
MCV: 92.8 fL (ref 80.0–100.0)
Monocytes Absolute: 0.9 10*3/uL (ref 0.1–1.0)
Monocytes Relative: 15 %
Neutro Abs: 3 10*3/uL (ref 1.7–7.7)
Neutrophils Relative %: 49 %
Platelets: 252 10*3/uL (ref 150–400)
RBC: 4.97 MIL/uL (ref 3.87–5.11)
RDW: 12 % (ref 11.5–15.5)
WBC: 6 10*3/uL (ref 4.0–10.5)
nRBC: 0 % (ref 0.0–0.2)

## 2022-06-11 LAB — LACTIC ACID, PLASMA
Lactic Acid, Venous: 1.9 mmol/L (ref 0.5–1.9)
Lactic Acid, Venous: 5.1 mmol/L (ref 0.5–1.9)

## 2022-06-11 LAB — COMPREHENSIVE METABOLIC PANEL
ALT: 25 U/L (ref 0–44)
AST: 40 U/L (ref 15–41)
Albumin: 4.2 g/dL (ref 3.5–5.0)
Alkaline Phosphatase: 75 U/L (ref 38–126)
Anion gap: 12 (ref 5–15)
BUN: 11 mg/dL (ref 6–20)
CO2: 20 mmol/L — ABNORMAL LOW (ref 22–32)
Calcium: 9.1 mg/dL (ref 8.9–10.3)
Chloride: 104 mmol/L (ref 98–111)
Creatinine, Ser: 1.14 mg/dL — ABNORMAL HIGH (ref 0.44–1.00)
GFR, Estimated: 59 mL/min — ABNORMAL LOW (ref >60)
Glucose, Bld: 98 mg/dL (ref 70–99)
Potassium: 3.5 mmol/L (ref 3.5–5.1)
Sodium: 136 mmol/L (ref 135–145)
Total Bilirubin: 0.4 mg/dL (ref 0.3–1.2)
Total Protein: 8.6 g/dL — ABNORMAL HIGH (ref 6.5–8.1)

## 2022-06-11 LAB — RESP PANEL BY RT-PCR (RSV, FLU A&B, COVID)  RVPGX2
Influenza A by PCR: POSITIVE — AB
Influenza B by PCR: NEGATIVE
Resp Syncytial Virus by PCR: NEGATIVE
SARS Coronavirus 2 by RT PCR: NEGATIVE

## 2022-06-11 LAB — PHOSPHORUS: Phosphorus: 2.9 mg/dL (ref 2.5–4.6)

## 2022-06-11 LAB — APTT: aPTT: 30 seconds (ref 24–36)

## 2022-06-11 LAB — PROCALCITONIN: Procalcitonin: 0.3 ng/mL

## 2022-06-11 LAB — PROTIME-INR
INR: 1.1 (ref 0.8–1.2)
Prothrombin Time: 13.7 seconds (ref 11.4–15.2)

## 2022-06-11 LAB — MAGNESIUM: Magnesium: 2.1 mg/dL (ref 1.7–2.4)

## 2022-06-11 MED ORDER — IPRATROPIUM-ALBUTEROL 0.5-2.5 (3) MG/3ML IN SOLN
3.0000 mL | Freq: Once | RESPIRATORY_TRACT | Status: AC
  Administered 2022-06-11: 3 mL via RESPIRATORY_TRACT
  Filled 2022-06-11: qty 3

## 2022-06-11 MED ORDER — ONDANSETRON HCL 4 MG/2ML IJ SOLN: 4.0000 mg | Freq: Four times a day (QID) | INTRAMUSCULAR | Status: DC | PRN

## 2022-06-11 MED ORDER — BENZONATATE 100 MG PO CAPS
200.0000 mg | ORAL_CAPSULE | Freq: Two times a day (BID) | ORAL | Status: AC | PRN
  Administered 2022-06-12 – 2022-06-13 (×2): 200 mg via ORAL
  Filled 2022-06-11 (×3): qty 2

## 2022-06-11 MED ORDER — KETOROLAC TROMETHAMINE 15 MG/ML IJ SOLN: 15.0000 mg | Freq: Four times a day (QID) | INTRAMUSCULAR | Status: AC | PRN

## 2022-06-11 MED ORDER — FAMOTIDINE 20 MG PO TABS
40.0000 mg | ORAL_TABLET | Freq: Every morning | ORAL | Status: DC
  Administered 2022-06-12 – 2022-06-15 (×4): 40 mg via ORAL
  Filled 2022-06-11 (×4): qty 2

## 2022-06-11 MED ORDER — SODIUM CHLORIDE 0.9 % IV BOLUS
1000.0000 mL | Freq: Once | INTRAVENOUS | Status: AC
  Administered 2022-06-11: 1000 mL via INTRAVENOUS

## 2022-06-11 MED ORDER — ACETAMINOPHEN 325 MG PO TABS: 650.0000 mg | ORAL_TABLET | Freq: Four times a day (QID) | ORAL | Status: DC | PRN

## 2022-06-11 MED ORDER — METHYLPREDNISOLONE SODIUM SUCC 40 MG IJ SOLR
40.0000 mg | Freq: Two times a day (BID) | INTRAMUSCULAR | Status: AC
  Administered 2022-06-12 (×2): 40 mg via INTRAVENOUS
  Filled 2022-06-11 (×2): qty 1

## 2022-06-11 MED ORDER — VANCOMYCIN HCL 1500 MG/300ML IV SOLN
1500.0000 mg | Freq: Once | INTRAVENOUS | Status: AC
  Administered 2022-06-12: 1500 mg via INTRAVENOUS
  Filled 2022-06-11: qty 300

## 2022-06-11 MED ORDER — IPRATROPIUM-ALBUTEROL 0.5-2.5 (3) MG/3ML IN SOLN
3.0000 mL | Freq: Four times a day (QID) | RESPIRATORY_TRACT | Status: AC
  Administered 2022-06-11 – 2022-06-12 (×4): 3 mL via RESPIRATORY_TRACT
  Filled 2022-06-11 (×4): qty 3

## 2022-06-11 MED ORDER — PREDNISONE 20 MG PO TABS
40.0000 mg | ORAL_TABLET | Freq: Every day | ORAL | Status: DC
  Administered 2022-06-13 – 2022-06-14 (×2): 40 mg via ORAL
  Filled 2022-06-11 (×2): qty 2

## 2022-06-11 MED ORDER — ACETAMINOPHEN 650 MG RE SUPP: 650.0000 mg | Freq: Four times a day (QID) | RECTAL | Status: DC | PRN

## 2022-06-11 MED ORDER — MORPHINE SULFATE (PF) 4 MG/ML IV SOLN
4.0000 mg | Freq: Once | INTRAVENOUS | Status: AC
  Administered 2022-06-11: 4 mg via INTRAVENOUS
  Filled 2022-06-11: qty 1

## 2022-06-11 MED ORDER — ROSUVASTATIN CALCIUM 10 MG PO TABS
10.0000 mg | ORAL_TABLET | Freq: Every day | ORAL | Status: DC
  Administered 2022-06-11 – 2022-06-14 (×4): 10 mg via ORAL
  Filled 2022-06-11 (×4): qty 1

## 2022-06-11 MED ORDER — OSELTAMIVIR PHOSPHATE 75 MG PO CAPS
75.0000 mg | ORAL_CAPSULE | Freq: Two times a day (BID) | ORAL | Status: DC
  Administered 2022-06-11 – 2022-06-15 (×8): 75 mg via ORAL
  Filled 2022-06-11 (×8): qty 1

## 2022-06-11 MED ORDER — ENOXAPARIN SODIUM 40 MG/0.4ML IJ SOSY
40.0000 mg | PREFILLED_SYRINGE | INTRAMUSCULAR | Status: DC
  Administered 2022-06-11 – 2022-06-14 (×4): 40 mg via SUBCUTANEOUS
  Filled 2022-06-11 (×4): qty 0.4

## 2022-06-11 MED ORDER — MORPHINE SULFATE (PF) 2 MG/ML IV SOLN: 2.0000 mg | INTRAVENOUS | Status: DC | PRN

## 2022-06-11 MED ORDER — LORAZEPAM 0.5 MG PO TABS: 0.5000 mg | ORAL_TABLET | Freq: Four times a day (QID) | ORAL | Status: DC | PRN

## 2022-06-11 MED ORDER — ONDANSETRON HCL 4 MG PO TABS: 4.0000 mg | ORAL_TABLET | Freq: Four times a day (QID) | ORAL | Status: DC | PRN

## 2022-06-11 MED ORDER — HYDROCOD POLI-CHLORPHE POLI ER 10-8 MG/5ML PO SUER
5.0000 mL | Freq: Every evening | ORAL | Status: DC | PRN
  Administered 2022-06-11: 5 mL via ORAL
  Filled 2022-06-11: qty 5

## 2022-06-11 MED ORDER — MIRTAZAPINE 15 MG PO TABS
15.0000 mg | ORAL_TABLET | Freq: Every day | ORAL | Status: DC
  Administered 2022-06-11 – 2022-06-14 (×4): 15 mg via ORAL
  Filled 2022-06-11 (×4): qty 1

## 2022-06-11 MED ORDER — LEVOFLOXACIN IN D5W 750 MG/150ML IV SOLN
750.0000 mg | INTRAVENOUS | Status: DC
  Administered 2022-06-11: 750 mg via INTRAVENOUS
  Filled 2022-06-11: qty 150

## 2022-06-11 MED ORDER — SENNOSIDES-DOCUSATE SODIUM 8.6-50 MG PO TABS: 1.0000 | ORAL_TABLET | Freq: Two times a day (BID) | ORAL | Status: DC | PRN

## 2022-06-11 MED ORDER — LORAZEPAM 2 MG/ML IJ SOLN: 0.5000 mg | Freq: Four times a day (QID) | INTRAMUSCULAR | Status: DC | PRN

## 2022-06-11 MED ORDER — VANCOMYCIN HCL 1250 MG/250ML IV SOLN: 1250.0000 mg | INTRAVENOUS | Status: DC

## 2022-06-11 MED ORDER — LIDOCAINE 5 % EX PTCH
1.0000 | MEDICATED_PATCH | CUTANEOUS | Status: AC
  Administered 2022-06-11 – 2022-06-13 (×3): 1 via TRANSDERMAL
  Filled 2022-06-11 (×3): qty 1

## 2022-06-11 MED ORDER — OSELTAMIVIR PHOSPHATE 75 MG PO CAPS
75.0000 mg | ORAL_CAPSULE | Freq: Once | ORAL | Status: AC
  Administered 2022-06-11: 75 mg via ORAL
  Filled 2022-06-11: qty 1

## 2022-06-11 MED ORDER — NICOTINE 21 MG/24HR TD PT24: 21.0000 mg | MEDICATED_PATCH | Freq: Every day | TRANSDERMAL | Status: DC | PRN

## 2022-06-11 MED ORDER — KETOROLAC TROMETHAMINE 30 MG/ML IJ SOLN
15.0000 mg | Freq: Once | INTRAMUSCULAR | Status: AC
  Administered 2022-06-11: 15 mg via INTRAVENOUS
  Filled 2022-06-11: qty 1

## 2022-06-11 MED ORDER — SODIUM CHLORIDE 0.9 % IV SOLN: 500.0000 mg | INTRAVENOUS | Status: DC

## 2022-06-11 MED ORDER — OXYBUTYNIN CHLORIDE 5 MG PO TABS
5.0000 mg | ORAL_TABLET | Freq: Every morning | ORAL | Status: DC
  Administered 2022-06-12 – 2022-06-15 (×4): 5 mg via ORAL
  Filled 2022-06-11 (×4): qty 1

## 2022-06-11 MED ORDER — HYDROCOD POLI-CHLORPHE POLI ER 10-8 MG/5ML PO SUER
5.0000 mL | Freq: Once | ORAL | Status: AC
  Administered 2022-06-11: 5 mL via ORAL
  Filled 2022-06-11: qty 5

## 2022-06-11 MED ORDER — METHYLPREDNISOLONE SODIUM SUCC 125 MG IJ SOLR
125.0000 mg | Freq: Once | INTRAMUSCULAR | Status: AC
  Administered 2022-06-11: 125 mg via INTRAVENOUS
  Filled 2022-06-11: qty 2

## 2022-06-11 NOTE — H&P (Addendum)
History and Physical   [name]:[id number] DOB: 08-28-1970 DOA: 06/11/2022  PCP: Fayrene Helper, NP  Outpatient Specialists: [facility]. [facility] Patient coming from: Home via POV  I have personally briefly reviewed patient's old medical records in [facility] EMR.  Chief Concern: Shortness of breath, cough, chest pain with coughing  HPI: [name] is a 51 year old female with tobacco use dependence, GERD, suspected undiagnosed COPD, who presents to emergency department for chief concerns of shortness of breath, cough, and left chest tenderness with coughing.  She further endorses subjective fever at home.  Initial vitals in the ED showed temperature of 99.1, respiration rate of 36, heart rate of 128, blood pressure 103/87, SpO2 was initially in the high 80s on room air, and improved to 98% on 2 L nasal cannula.  Serum sodium is 136, potassium 2.5, chloride 104, bicarb 20, BUN of 11, serum creatinine of 1.14, GFR 59, nonfasting blood glucose 98, WBC 6.0, hemoglobin 15.1, platelets of 252.  Lactic acid was 1.9.  Influenza A by PCR was positive.  COVID/influenza B/RSV were negative by PCR.  ED treatment: DuoNebs x 2, Tussionex 5 mg p.o., Toradol 15 mg IV, Solu-Medrol 125 mg IV, morphine 4 mg IV, Tamiflu 75 mg p.o., sodium chloride 1 L bolus. ----------------------- At bedside, she is able to tell me her name, age, current location of hospital.   She endorses a temperature max of 100.1 on Friday.  She endorses nonproductive cough since Friday, 06/08/2022. She endorses watery stool once per day.  She had 1 episode of watery diarrhea on Friday and today.  She denies nausea, vomiting, dysuria, swelling of her lower extremity, syncope.  She reports that her daughter who is a nurse told her to come to the emergency department on Friday.  Patient did not listen to her daughter.  Social history: She endorses tobacco use, smoking 1 pack/day since  teenage years.  She denies EtOH and recreational drug use.  ROS: Constitutional: no weight change, no fever ENT/Mouth: no sore throat, no rhinorrhea Eyes: no eye pain, no vision changes Cardiovascular: + chest tenderness with cough, + dyspnea,  no edema, no palpitations Respiratory: + cough, no sputum, no wheezing Gastrointestinal: no nausea, no vomiting, no diarrhea, no constipation Genitourinary: no urinary incontinence, no dysuria, no hematuria Musculoskeletal: no arthralgias, no myalgias Skin: no skin lesions, no pruritus Neuro: + weakness, no loss of consciousness, no syncope Psych: no anxiety, no depression, + decrease appetite Heme/Lymph: no bruising, no bleeding  ED Course: Discussed with emergency medicine provider, patient requiring hospitalization for chief concerns of acute hypoxic respiratory failure presumed secondary to undiagnosed COPD exacerbation.  Assessment/Plan  Principal Problem:   Acute hypoxemic respiratory failure (HCC) Active Problems:   Influenza A   Tobacco dependence   Musculoskeletal chest pain   Coughing   Shortness of breath   Diarrhea   Assessment and Plan:  * Acute hypoxemic respiratory failure (HCC) - Presumed secondary to undiagnosed COPD, exacerbated by influenza A - Check procalcitonin, if elevated, I will initiate antibiotic therapy; which should to be elevated at 0.30 and second lactic acid is elevated.  - blood cultures x2 are in process, and I have initated azithromycin 500 mg IV daily, aztreonam and vancomycin per pharmamcy, and I  have a timed repeat lactic acid ordered - Pharmacy states she has tolerated levaquin in the past, and I responded that this can be used instead of aztreonam and vanc - Influenza A treatment with Tamiflu -  DuoNebs scheduled 4 times daily, 4 doses ordered - Solu-Medrol 40 mg IV twice daily, 1 day ordered with transition to oral prednisone 40 mg daily with breakfast, for 4 days - Admit to telemetry medical,  inpatient  Influenza A - Status post one-time dose of Tamiflu per EDP - Per current recommendation, for patients with COPD and I suspect the patient has undiagnosed COPD, influenza A can be a source of etiology for COPD exacerbation.  I have continue treatment. -Tamiflu 75 mg p.o. twice daily, 9 additional doses ordered, to complete 5-day course  Diarrhea Query gastroenteritis secondary to ifnluenza  Shortness of breath - Presumed secondary to influenza A in a patient with tobacco dependence since age 68 - Patient may have undiagnosed COPD and now in exacerbation - Ativan 0.5 mg p.o. or IV every 6 hours as needed for anxiety, 2 doses ordered  Coughing - Presumed secondary to influenza A - Symptomatic support - Tussionex 5 mg p.o. nightly as needed for cough, 3 days ordered  Musculoskeletal chest pain - Secondary to coughing - Lidocaine patch daily to be placed at left-sided anterior rib beneath left breast - Toradol 15 mg IV every 6 hours as needed for moderate pain, 1 day ordered; morphine 2 mg IV every 4 hours as needed for severe pain, 3 doses ordered - AM team to reevaluate patient at bedside for continued moderate and severe pain medications requirement  Tobacco dependence - Nicotine patch as needed for nicotine craving ordered - She reports she is not ready to quit smoking  QT prolongation-avoid scheduled QT prolonging agents  Chart reviewed.   DVT prophylaxis: Enoxaparin Code Status: Full code Diet: Heart healthy Family Communication: Updated daughter at [phone], Arcelia Pals and Ms. Qualls would like to be updated daily.  Disposition Plan: Pending clinical course Consults called: None at this time Admission status: Inpatient, medical telemetry  Past Medical History:  Diagnosis Date   Depression    GERD (gastroesophageal reflux disease)    Helicobacter pylori gastritis    Irritable bowel syndrome (IBS)    Tobacco use    Past Surgical History:   Procedure Laterality Date   ESOPHAGOGASTRODUODENOSCOPY (EGD) WITH PROPOFOL N/A 01/08/2018   Procedure: ESOPHAGOGASTRODUODENOSCOPY (EGD) WITH PROPOFOL;  Surgeon: Scot Jun, MD;  Location: [facility] ENDOSCOPY;  Service: Endoscopy;  Laterality: N/A;   INCISE AND DRAIN ABCESS     Social History:  reports that she has been smoking cigarettes. She has been smoking an average of 1 pack per day. She has never used smokeless tobacco. She reports current drug use. Drug: Marijuana. She reports that she does not drink alcohol.  Allergies  Allergen Reactions   Penicillins Hives and Swelling    Has patient had a PCN reaction causing immediate rash, facial/tongue/throat swelling, SOB or lightheadedness with hypotension: Yes Has patient had a PCN reaction causing severe rash involving mucus membranes or skin necrosis: No Has patient had a PCN reaction that required hospitalization: Unknown Has patient had a PCN reaction occurring within the last 10 years: No If all of the above answers are "NO", then may proceed with Cephalosporin use.   Family History  Problem Relation Age of Onset   Prostate cancer Father    Family history: Family history reviewed and not pertinent  Prior to Admission medications   Medication Sig Start Date End Date Taking? Authorizing Provider  albuterol (PROVENTIL HFA;VENTOLIN HFA) 108 (90 Base) MCG/ACT inhaler Inhale 2 puffs into the lungs every 6 (six) hours as needed for wheezing or  shortness of breath. 11/30/17   Dionne Bucy, MD  albuterol (PROVENTIL) (2.5 MG/3ML) 0.083% nebulizer solution Take 3 mLs (2.5 mg total) by nebulization every 6 (six) hours as needed for wheezing or shortness of breath. 11/30/17   Dionne Bucy, MD  bisacodyl (DULCOLAX) 5 MG EC tablet Take by mouth.    [provider]  CYCLOBENZAPRINE HCL ER PO Take by mouth daily.    [provider]  dicyclomine (BENTYL) 10 MG capsule Take 10 mg by mouth 4 (four) times daily.     [provider]  doxycycline (VIBRA-TABS) 100 MG tablet Take 100 mg by mouth 2 (two) times daily. 04/04/20   [provider]  etonogestrel (NEXPLANON) 68 MG IMPL implant 1 each by Subdermal route once.    [provider]  famotidine (PEPCID) 40 MG tablet Take by mouth. 02/28/17   [provider]  gabapentin (NEURONTIN) 100 MG capsule Take 100 mg by mouth 2 (two) times daily.    [provider]  ibuprofen (ADVIL,MOTRIN) 600 MG tablet Take 1 tablet (600 mg total) by mouth every 6 (six) hours as needed. Patient not taking: Reported on 01/08/2018 11/30/17   Dionne Bucy, MD  lurasidone (LATUDA) 40 MG TABS tablet Take 40 mg by mouth daily.    [provider]  mirtazapine (REMERON) 15 MG tablet Take 15 mg by mouth at bedtime. 05/02/20   [provider]  omeprazole (PRILOSEC) 40 MG capsule Take 1 capsule (40 mg total) by mouth daily. 12/19/20 01/18/21  Shaune Pollack, MD  ondansetron (ZOFRAN ODT) 4 MG disintegrating tablet Take 1 tablet (4 mg total) by mouth every 8 (eight) hours as needed for nausea or vomiting. 12/19/20   Shaune Pollack, MD  ondansetron (ZOFRAN) 4 MG tablet Take by mouth.    [provider]  promethazine (PHENERGAN) 25 MG suppository Place 1 suppository (25 mg total) rectally every 8 (eight) hours as needed for refractory nausea / vomiting. 12/19/20   Shaune Pollack, MD  QUEtiapine (SEROQUEL) 100 MG tablet Take 100 mg by mouth 2 (two) times daily.    [provider]  sucralfate (CARAFATE) 1 GM/10ML suspension Take 10 mLs (1 g total) by mouth 4 (four) times daily. 12/20/17   Irean Hong, MD  terbinafine (LAMISIL) 250 MG tablet Take 1 tablet (250 mg total) by mouth daily. 05/04/20   Edwin Cap, DPM   Physical Exam: Vitals:   06/11/22 1355 06/11/22 1358 06/11/22 1546 06/11/22 1934  BP: 112/82  (!) 91/59 128/80  Pulse: (!) 117  95 94  Resp: 18  18 (!) 26  Temp:    98.3 F (36.8 C)  TempSrc:    Oral   SpO2: 93% 98% 97% 95%  Weight:      Height:       Constitutional: appears age-appropriate, NAD, calm, comfortable Eyes: PERRL, lids and conjunctivae normal ENMT: Mucous membranes are moist. Posterior pharynx clear of any exudate or lesions. Age-appropriate dentition. Hearing appropriate Neck: normal, supple, no masses, no thyromegaly Respiratory: clear to auscultation bilaterally.  Expiratory wheezing on auscultation and no crackles. Increased respiratory effort. No accessory muscle use.  Cardiovascular: Regular rate and rhythm, no murmurs / rubs / gallops. No extremity edema. 2+ pedal pulses. No carotid bruits.  Abdomen: no tenderness, no masses palpated, no hepatosplenomegaly. Bowel sounds positive.  Musculoskeletal: no clubbing / cyanosis. No joint deformity upper and lower extremities. Good ROM, no contractures, no atrophy. Normal muscle tone.  Skin: no rashes, lesions, ulcers. No induration Neurologic:  Sensation intact. Strength 5/5 in all 4.  Psychiatric: Normal judgment and insight. Alert and oriented x 3. Normal mood.   EKG: independently reviewed, showing tachycardia with rate of 137, QTc 540  Chest x-ray on Admission: I personally reviewed and I agree with radiologist reading as below.  DG Chest Port 1 View  Result Date: 06/11/2022 CLINICAL DATA:  Sepsis, cough, dyspnea EXAM: PORTABLE CHEST 1 VIEW COMPARISON:  12/19/2017 chest radiograph. FINDINGS: Stable cardiomediastinal silhouette with normal heart size. No pneumothorax. No pleural effusion. Lungs appear clear, with no acute consolidative airspace disease and no pulmonary edema. IMPRESSION: No active disease. Electronically Signed   By: Delbert PhenixJason A Poff M.D.   On: 06/11/2022 12:43    Labs on Admission: I have personally reviewed following labs  CBC: Recent Labs  Lab 06/11/22 1208  WBC 6.0  NEUTROABS 3.0  HGB 15.1*  HCT 46.1*  MCV 92.8  PLT 252   Basic Metabolic Panel: Recent Labs  Lab 06/11/22 1208  NA 136  K 3.5   CL 104  CO2 20*  GLUCOSE 98  BUN 11  CREATININE 1.14*  CALCIUM 9.1  MG 2.1  PHOS 2.9   GFR: Estimated Creatinine Clearance: 64.5 mL/min (A) (by C-G formula based on SCr of 1.14 mg/dL (H)).  Liver Function Tests: Recent Labs  Lab 06/11/22 1208  AST 40  ALT 25  ALKPHOS 75  BILITOT 0.4  PROT 8.6*  ALBUMIN 4.2   Coagulation Profile: Recent Labs  Lab 06/11/22 1208  INR 1.1   Urine analysis:    Component Value Date/Time   COLORURINE AMBER (A) 12/19/2017 2025   APPEARANCEUR CLEAR (A) 12/19/2017 2025   APPEARANCEUR Hazy 02/05/2014 1106   LABSPEC 1.026 12/19/2017 2025   LABSPEC 1.029 02/05/2014 1106   PHURINE 6.0 12/19/2017 2025   GLUCOSEU NEGATIVE 12/19/2017 2025   GLUCOSEU Negative 02/05/2014 1106   HGBUR NEGATIVE 12/19/2017 2025   BILIRUBINUR NEGATIVE 12/19/2017 2025   BILIRUBINUR Negative 02/05/2014 1106   KETONESUR NEGATIVE 12/19/2017 2025   PROTEINUR 30 (A) 12/19/2017 2025   NITRITE NEGATIVE 12/19/2017 2025   LEUKOCYTESUR TRACE (A) 12/19/2017 2025   LEUKOCYTESUR Negative 02/05/2014 1106   Dr. Sedalia Mutaox [facility]  If 7PM-7AM, please contact overnight-coverage provider If 7AM-7PM, please contact day coverage provider [email or web address]  06/11/2022, 9:15 PM

## 2022-06-11 NOTE — Assessment & Plan Note (Signed)
-   Presumed secondary to influenza A in a patient with tobacco dependence since age 51 - Patient may have undiagnosed COPD and now in exacerbation - Ativan 0.5 mg p.o. or IV every 6 hours as needed for anxiety, 2 doses ordered

## 2022-06-11 NOTE — Assessment & Plan Note (Addendum)
-   Presumed secondary to undiagnosed COPD, exacerbated by influenza A - Check procalcitonin, if elevated, I will initiate antibiotic therapy; which should to be elevated at 0.30 and second lactic acid is elevated.  - blood cultures x2 are in process, and I have initated azithromycin 500 mg IV daily, aztreonam and vancomycin per pharmamcy, and I  have a timed repeat lactic acid ordered - Pharmacy states she has tolerated levaquin in the past, and I responded that this can be used instead of aztreonam and vanc - Influenza A treatment with Tamiflu - DuoNebs scheduled 4 times daily, 4 doses ordered - Solu-Medrol 40 mg IV twice daily, 1 day ordered with transition to oral prednisone 40 mg daily with breakfast, for 4 days - Admit to telemetry medical, inpatient

## 2022-06-11 NOTE — Assessment & Plan Note (Signed)
-   Presumed secondary to influenza A - Symptomatic support - Tussionex 5 mg p.o. nightly as needed for cough, 3 days ordered

## 2022-06-11 NOTE — Assessment & Plan Note (Signed)
-   Secondary to coughing - Lidocaine patch daily to be placed at left-sided anterior rib beneath left breast - Toradol 15 mg IV every 6 hours as needed for moderate pain, 1 day ordered; morphine 2 mg IV every 4 hours as needed for severe pain, 3 doses ordered - AM team to reevaluate patient at bedside for continued moderate and severe pain medications requirement

## 2022-06-11 NOTE — Progress Notes (Signed)
Pharmacy Antibiotic Note  Kim Howard is a 51 y.o. female admitted on 06/11/2022 with pneumonia.  Pharmacy has been consulted for vancomycin and levofloxacin dosing.  Plan:  1) vancomycin 1500 mg IV x 1 then 1250 mg every 24 hours Goal AUC 400-550. Expected AUC: 418.8 SCr used: 1.19 mg/dL Ke 1.155 h-1, M0/8 12.9h MRSA PCR for potential narrowing of antibiotics  2) start levofloxacin 750 mg IV every 24 hours  Height: 5\' 8"  (172.7 cm) Weight: 77.1 kg (170 lb) IBW/kg (Calculated) : 63.9  Temp (24hrs), Avg:98.8 F (37.1 C), Min:98.3 F (36.8 C), Max:99.1 F (37.3 C)  Recent Labs  Lab 06/11/22 1208 06/11/22 1945  WBC 6.0  --   CREATININE 1.14*  --   LATICACIDVEN 1.9 5.1*    Estimated Creatinine Clearance: 64.5 mL/min (A) (by C-G formula based on SCr of 1.14 mg/dL (H)).    Allergies  Allergen Reactions   Penicillins Hives and Swelling    Has patient had a PCN reaction causing immediate rash, facial/tongue/throat swelling, SOB or lightheadedness with hypotension: Yes Has patient had a PCN reaction causing severe rash involving mucus membranes or skin necrosis: No Has patient had a PCN reaction that required hospitalization: Unknown Has patient had a PCN reaction occurring within the last 10 years: No If all of the above answers are "NO", then may proceed with Cephalosporin use.    Antimicrobials this admission: 12/11 vancomycin >>  12/11 levofloxacin >>   Microbiology results: 12/11 BCx: pending 12/11 MRSA PCR: pending  Thank you for allowing pharmacy to be a part of this patient's care.  14/11 06/11/2022 9:07 PM

## 2022-06-11 NOTE — ED Notes (Signed)
Oxygen turned off.  Her pulse ox goes to 88-89 when she has her coughing spells.    Cough is non productive.

## 2022-06-11 NOTE — Progress Notes (Signed)
       CROSS COVER NOTE  NAME: GRICELDA FOLAND MRN: [id number] DOB : 06-10-1971 ATTENDING PHYSICIAN: Cox, Amy N, DO    Date of Service   06/11/2022   HPI/Events of Note   Notified of elevated Lactic--> 5.1. Procalcitonin 0.3 today at noon. Patient is currently afebrile.   Interventions   Assessment/Plan:  1L NS bolus  Vancomycin and Aztreonam added by admitting physician    This document was prepared using Dragon voice recognition software and may include unintentional dictation errors.  Bishop Limbo DNP, MBA, FNP-BC Nurse Practitioner [facility] Pager [phone]

## 2022-06-11 NOTE — Assessment & Plan Note (Signed)
Query gastroenteritis secondary to ifnluenza

## 2022-06-11 NOTE — Assessment & Plan Note (Addendum)
-   Nicotine patch as needed for nicotine craving ordered - She reports she is not ready to quit smoking

## 2022-06-11 NOTE — ED Notes (Signed)
Post neb patient has audible wheezing.  Some trouble with talking.  Says sharp pains in chest.

## 2022-06-11 NOTE — ED Provider Notes (Signed)
[facility] Provider Note    Event Date/Time   First MD Initiated Contact with Patient 06/11/22 1253     (approximate)  History   Chief Complaint: Chest Pain and Shortness of Breath  HPI  Kim Howard is a 51 y.o. female with a past medical history of gastric reflux, depression, presents the emergency department for shortness of breath and cough.  According to the patient for the past 3 days she has been coughing with subjective fever and bodyaches.  States she felt more short of breath today so she came to the emergency department.  Patient does state some right-sided chest pain only when coughing.  Patient denies any abdominal pain or vomiting.  Has not measured a temperature but has felt warm per patient.  99.1 in the emergency department.  Patient does have wheeze in the emergency department as well.  Denies any history of COPD or asthma but is a daily smoker.  No O2 use at home.  Currently 92% on room air.  Physical Exam   Triage Vital Signs: ED Triage Vitals  Enc Vitals Group     BP 06/11/22 1156 103/87     Pulse Rate 06/11/22 1156 (!) 128     Resp 06/11/22 1156 (!) 36     Temp 06/11/22 1156 99.1 F (37.3 C)     Temp Source 06/11/22 1156 Oral     SpO2 06/11/22 1156 92 %     Weight 06/11/22 1158 170 lb (77.1 kg)     Height 06/11/22 1158 5\' 8"  (1.727 m)     Head Circumference --      Peak Flow --      Pain Score 06/11/22 1158 9     Pain Loc --      Pain Edu? --      Excl. in GC? --     Most recent vital signs: Vitals:   06/11/22 1156  BP: 103/87  Pulse: (!) 128  Resp: (!) 36  Temp: 99.1 F (37.3 C)  SpO2: 92%    General: Awake, no distress.  CV:  Good peripheral perfusion.  Regular rhythm rate around 120 bpm Resp:  Mild tachypnea around 25 to 30 breaths/min.  Mild expiratory wheeze with frequent cough during exam but no rales or rhonchi. Abd:  No distention.  Soft, nontender.  No rebound or guarding.   ED Results / Procedures /  Treatments   EKG  EKG viewed and interpreted by myself shows sinus tachycardia 137 bpm with a narrow QRS, normal axis, normal intervals besides slight QTc prolongation, nonspecific ST changes without ST elevation.  RADIOLOGY  I have reviewed and interpreted the chest x-ray images.  No consolidation seen on my evaluation. Radiology has read the chest x-ray as negative   MEDICATIONS ORDERED IN ED: Medications  ipratropium-albuterol (DUONEB) 0.5-2.5 (3) MG/3ML nebulizer solution 3 mL (has no administration in time range)  ipratropium-albuterol (DUONEB) 0.5-2.5 (3) MG/3ML nebulizer solution 3 mL (has no administration in time range)  ipratropium-albuterol (DUONEB) 0.5-2.5 (3) MG/3ML nebulizer solution 3 mL (has no administration in time range)  methylPREDNISolone sodium succinate (SOLU-MEDROL) 125 mg/2 mL injection 125 mg (has no administration in time range)  chlorpheniramine-HYDROcodone (TUSSIONEX) 10-8 MG/5ML suspension 5 mL (has no administration in time range)  ipratropium-albuterol (DUONEB) 0.5-2.5 (3) MG/3ML nebulizer solution 3 mL (3 mLs Nebulization Given 06/11/22 1219)  sodium chloride 0.9 % bolus 1,000 mL (1,000 mLs Intravenous New Bag/Given 06/11/22 1221)     IMPRESSION / MDM /  ASSESSMENT AND PLAN / ED COURSE  I reviewed the triage vital signs and the nursing notes.  Patient's presentation is most consistent with acute presentation with potential threat to life or bodily function.  Patient presents emergency department for shortness of breath and cough.  Patient has wheeze mild tachypnea as well as tachycardia on exam.  Patient is a daily smoker.  We will dose Solu-Medrol and DuoNebs.  We will check labs and continue to closely monitor.  Differential would include pneumonia, URI, viral syndrome, ACS, less likely PE.  Patient's lab work is largely reassuring including a normal chemistry, normal CBC with a normal white blood cell count lactic acid of 1.9.  Flu/RSV/COVID is  pending.  Chest x-ray is clear.  Patient states influenza A test is positive.  She continues to have cough congestion or shortness of breath.  Continues to be hypoxic into the upper 80s on room air satting in the mid upper 90s on 2 L.  Given the patient's intermittent hypoxia and continued shortness of breath tachypnea and tachycardia which I believe are all related to the patient's influenza A illness we will admit to the hospital service for ongoing treatment.  Patient has received IV Solu-Medrol as well as oral Tamiflu.  FINAL CLINICAL IMPRESSION(S) / ED DIAGNOSES   Upper respiratory infection Influenza A   Note:  This document was prepared using Dragon voice recognition software and may include unintentional dictation errors.   Minna Antis, MD 06/11/22 1435

## 2022-06-11 NOTE — Assessment & Plan Note (Signed)
-   Status post one-time dose of Tamiflu per EDP - Per current recommendation, for patients with COPD and I suspect the patient has undiagnosed COPD, influenza A can be a source of etiology for COPD exacerbation.  I have continue treatment. -Tamiflu 75 mg p.o. twice daily, 9 additional doses ordered, to complete 5-day course

## 2022-06-11 NOTE — ED Provider Triage Note (Signed)
Emergency Medicine Provider Triage Evaluation Note  Kim Howard , a 51 y.o. female  was evaluated in triage.  Pt complains of cough and congestion, shortness of breath, wheezing.  Review of Systems  Positive:  Negative:   Physical Exam  BP 103/87   Pulse (!) 128   Temp 99.1 F (37.3 C) (Oral)   Resp (!) 36   Ht 5\' 8"  (1.727 m)   Wt 77.1 kg   SpO2 92%   BMI 25.85 kg/m  Gen:   Awake, no distress   Resp:  Increased respiratory effort, wheezing noted MSK:   Moves extremities without difficulty  Other:    Medical Decision Making  Medically screening exam initiated at 12:02 PM.  Appropriate orders placed.  was informed that the remainder of the evaluation will be completed by another provider, this initial triage assessment does not replace that evaluation, and the importance of remaining in the ED until their evaluation is complete.     Kim Maw, PA-C 06/11/22 1202

## 2022-06-11 NOTE — ED Triage Notes (Signed)
Pt to ED from home with family member with complaints of chest pain under L breast. Pt has audible wheezing with labored respirations and abdominal muscle use. EKG performed.   Temp 99.1, pt coughing. Speaking in short sentences.  Pt began feeling sick 2 days ago. Denies hx asthma, COPD. Pt is 1 pack/day cigarette smoker since age 51.

## 2022-06-11 NOTE — Hospital Course (Signed)
Mr. Xoie Kreuser is a 51 year old female with tobacco use dependence, GERD, suspected undiagnosed COPD, who presents to emergency department for chief concerns of shortness of breath, cough, and left chest tenderness with coughing.  She further endorses subjective fever at home.  Initial vitals in the ED showed temperature of 99.1, respiration rate of 36, heart rate of 128, blood pressure 103/87, SpO2 was initially in the high 80s on room air, and improved to 98% on 2 L nasal cannula.  Serum sodium is 136, potassium 2.5, chloride 104, bicarb 20, BUN of 11, serum creatinine of 1.14, GFR 59, nonfasting blood glucose 98, WBC 6.0, hemoglobin 15.1, platelets of 252.  Lactic acid was 1.9.  Influenza A by PCR was positive.  COVID/influenza B/RSV were negative by PCR.  ED treatment: DuoNebs x 2, Tussionex 5 mg p.o., Toradol 15 mg IV, Solu-Medrol 125 mg IV, morphine 4 mg IV, Tamiflu 75 mg p.o., sodium chloride 1 L bolus.

## 2022-06-12 DIAGNOSIS — J9601 Acute respiratory failure with hypoxia: Secondary | ICD-10-CM | POA: Diagnosis not present

## 2022-06-12 LAB — CBC
HCT: 36.9 % (ref 36.0–46.0)
Hemoglobin: 12.2 g/dL (ref 12.0–15.0)
MCH: 31.3 pg (ref 26.0–34.0)
MCHC: 33.1 g/dL (ref 30.0–36.0)
MCV: 94.6 fL (ref 80.0–100.0)
Platelets: 203 10*3/uL (ref 150–400)
RBC: 3.9 MIL/uL (ref 3.87–5.11)
RDW: 12.1 % (ref 11.5–15.5)
WBC: 9.9 10*3/uL (ref 4.0–10.5)
nRBC: 0 % (ref 0.0–0.2)

## 2022-06-12 LAB — MAGNESIUM: Magnesium: 1.7 mg/dL (ref 1.7–2.4)

## 2022-06-12 LAB — BASIC METABOLIC PANEL
Anion gap: 7 (ref 5–15)
BUN: 12 mg/dL (ref 6–20)
CO2: 22 mmol/L (ref 22–32)
Calcium: 8.4 mg/dL — ABNORMAL LOW (ref 8.9–10.3)
Chloride: 110 mmol/L (ref 98–111)
Creatinine, Ser: 0.72 mg/dL (ref 0.44–1.00)
GFR, Estimated: >60 mL/min (ref >60)
Glucose, Bld: 100 mg/dL — ABNORMAL HIGH (ref 70–99)
Potassium: 3.9 mmol/L (ref 3.5–5.1)
Sodium: 139 mmol/L (ref 135–145)

## 2022-06-12 LAB — MRSA NEXT GEN BY PCR, NASAL: MRSA by PCR Next Gen: NOT DETECTED

## 2022-06-12 LAB — HIV ANTIBODY (ROUTINE TESTING W REFLEX): HIV Screen 4th Generation wRfx: NONREACTIVE

## 2022-06-12 LAB — LACTIC ACID, PLASMA: Lactic Acid, Venous: 2.7 mmol/L (ref 0.5–1.9)

## 2022-06-12 MED ORDER — DOXYCYCLINE HYCLATE 100 MG PO TABS
100.0000 mg | ORAL_TABLET | Freq: Two times a day (BID) | ORAL | Status: DC
  Administered 2022-06-12 – 2022-06-15 (×7): 100 mg via ORAL
  Filled 2022-06-12 (×7): qty 1

## 2022-06-12 MED ORDER — VANCOMYCIN HCL 1250 MG/250ML IV SOLN: 1250.0000 mg | Freq: Two times a day (BID) | INTRAVENOUS | Status: DC

## 2022-06-12 MED ORDER — PANTOPRAZOLE SODIUM 40 MG PO TBEC
40.0000 mg | DELAYED_RELEASE_TABLET | Freq: Every day | ORAL | Status: DC
  Administered 2022-06-12 – 2022-06-15 (×4): 40 mg via ORAL
  Filled 2022-06-12 (×4): qty 1

## 2022-06-12 MED ORDER — MAGNESIUM SULFATE 2 GM/50ML IV SOLN
2.0000 g | Freq: Once | INTRAVENOUS | Status: AC
  Administered 2022-06-12: 2 g via INTRAVENOUS
  Filled 2022-06-12: qty 50

## 2022-06-12 MED ORDER — HYDROCOD POLI-CHLORPHE POLI ER 10-8 MG/5ML PO SUER
5.0000 mL | Freq: Two times a day (BID) | ORAL | Status: DC | PRN
  Administered 2022-06-12: 5 mL via ORAL
  Filled 2022-06-12: qty 5

## 2022-06-12 MED ORDER — BUDESONIDE 0.25 MG/2ML IN SUSP
0.2500 mg | Freq: Two times a day (BID) | RESPIRATORY_TRACT | Status: DC
  Administered 2022-06-12 – 2022-06-15 (×7): 0.25 mg via RESPIRATORY_TRACT
  Filled 2022-06-12 (×7): qty 2

## 2022-06-12 MED ORDER — GUAIFENESIN ER 600 MG PO TB12
600.0000 mg | ORAL_TABLET | Freq: Two times a day (BID) | ORAL | Status: DC
  Administered 2022-06-12 – 2022-06-15 (×7): 600 mg via ORAL
  Filled 2022-06-12 (×7): qty 1

## 2022-06-12 MED ORDER — ALBUTEROL SULFATE (2.5 MG/3ML) 0.083% IN NEBU: 2.5000 mg | INHALATION_SOLUTION | RESPIRATORY_TRACT | Status: DC | PRN

## 2022-06-12 MED ORDER — IPRATROPIUM-ALBUTEROL 0.5-2.5 (3) MG/3ML IN SOLN
3.0000 mL | Freq: Four times a day (QID) | RESPIRATORY_TRACT | Status: DC
  Administered 2022-06-12 – 2022-06-14 (×9): 3 mL via RESPIRATORY_TRACT
  Filled 2022-06-12 (×8): qty 3

## 2022-06-12 NOTE — Progress Notes (Signed)
Pharmacy Antibiotic Note  Kim Howard is a 51 y.o. female admitted on 06/11/2022 with pneumonia.  Pharmacy has been consulted for vancomycin and levofloxacin dosing.  -+influenza A  Plan: Scr improved 1.14>>0.72 1)Will adjust vancomycin from 1250 mg every 24 hours to 1250mg  IV q12h Goal AUC 400-550. Expected AUC: 544 Cmin 15 SCr used: 0.72 mg/dL MRSA PCR for potential narrowing of antibiotics  2) levofloxacin 750 mg IV every 24 hours  Height: 5\' 8"  (172.7 cm) Weight: 77.1 kg (170 lb) IBW/kg (Calculated) : 63.9  Temp (24hrs), Avg:98.5 F (36.9 C), Min:98 F (36.7 C), Max:99.1 F (37.3 C)  Recent Labs  Lab 06/11/22 1208 06/11/22 1945 06/12/22 0300 06/12/22 0437  WBC 6.0  --   --  9.9  CREATININE 1.14*  --   --  0.72  LATICACIDVEN 1.9 5.1* 2.7*  --      Estimated Creatinine Clearance: 91.9 mL/min (by C-G formula based on SCr of 0.72 mg/dL).    Allergies  Allergen Reactions   Penicillins Hives and Swelling    Has patient had a PCN reaction causing immediate rash, facial/tongue/throat swelling, SOB or lightheadedness with hypotension: Yes Has patient had a PCN reaction causing severe rash involving mucus membranes or skin necrosis: No Has patient had a PCN reaction that required hospitalization: Unknown Has patient had a PCN reaction occurring within the last 10 years: No If all of the above answers are "NO", then may proceed with Cephalosporin use.    Antimicrobials this admission: 12/11 vancomycin >>  12/11 levofloxacin >>   Microbiology results: 12/11 BCx: pending 12/11 MRSA PCR: pending  Thank you for allowing pharmacy to be a part of this patient's care.  Ailish Prospero A 06/12/2022 9:53 AM

## 2022-06-12 NOTE — Plan of Care (Signed)
Problem: Education: Goal: Knowledge of disease or condition will improve 06/12/2022 1737 by Payton Spark, RN Outcome: Progressing 06/12/2022 1705 by Payton Spark, RN Outcome: Progressing Goal: Knowledge of the prescribed therapeutic regimen will improve 06/12/2022 1737 by Payton Spark, RN Outcome: Progressing 06/12/2022 1705 by Payton Spark, RN Outcome: Progressing Goal: Individualized Educational Video(s) 06/12/2022 1737 by Payton Spark, RN Outcome: Progressing 06/12/2022 1705 by Payton Spark, RN Outcome: Progressing   Problem: Activity: Goal: Ability to tolerate increased activity will improve 06/12/2022 1737 by Payton Spark, RN Outcome: Progressing 06/12/2022 1705 by Payton Spark, RN Outcome: Progressing Goal: Will verbalize the importance of balancing activity with adequate rest periods 06/12/2022 1737 by Payton Spark, RN Outcome: Progressing 06/12/2022 1705 by Payton Spark, RN Outcome: Progressing   Problem: Respiratory: Goal: Ability to maintain a clear airway will improve 06/12/2022 1737 by Payton Spark, RN Outcome: Progressing 06/12/2022 1705 by Payton Spark, RN Outcome: Progressing Goal: Levels of oxygenation will improve 06/12/2022 1737 by Payton Spark, RN Outcome: Progressing 06/12/2022 1705 by Payton Spark, RN Outcome: Progressing Goal: Ability to maintain adequate ventilation will improve 06/12/2022 1737 by Payton Spark, RN Outcome: Progressing 06/12/2022 1705 by Payton Spark, RN Outcome: Progressing   Problem: Education: Goal: Knowledge of General Education information will improve Description: Including pain rating scale, medication(s)/side effects and non-pharmacologic comfort measures 06/12/2022 1737 by Payton Spark, RN Outcome: Progressing 06/12/2022 1705 by Payton Spark, RN Outcome: Progressing   Problem: Health Behavior/Discharge Planning: Goal: Ability to manage health-related needs  will improve 06/12/2022 1737 by Payton Spark, RN Outcome: Progressing 06/12/2022 1705 by Payton Spark, RN Outcome: Progressing   Problem: Clinical Measurements: Goal: Ability to maintain clinical measurements within normal limits will improve 06/12/2022 1737 by Payton Spark, RN Outcome: Progressing 06/12/2022 1705 by Payton Spark, RN Outcome: Progressing Goal: Will remain free from infection 06/12/2022 1737 by Payton Spark, RN Outcome: Progressing 06/12/2022 1705 by Payton Spark, RN Outcome: Progressing Goal: Diagnostic test results will improve 06/12/2022 1737 by Payton Spark, RN Outcome: Progressing 06/12/2022 1705 by Payton Spark, RN Outcome: Progressing Goal: Respiratory complications will improve 06/12/2022 1737 by Payton Spark, RN Outcome: Progressing 06/12/2022 1705 by Payton Spark, RN Outcome: Progressing Goal: Cardiovascular complication will be avoided 06/12/2022 1737 by Payton Spark, RN Outcome: Progressing 06/12/2022 1705 by Payton Spark, RN Outcome: Progressing   Problem: Activity: Goal: Risk for activity intolerance will decrease 06/12/2022 1737 by Payton Spark, RN Outcome: Progressing 06/12/2022 1705 by Payton Spark, RN Outcome: Progressing   Problem: Nutrition: Goal: Adequate nutrition will be maintained 06/12/2022 1737 by Payton Spark, RN Outcome: Progressing 06/12/2022 1705 by Payton Spark, RN Outcome: Progressing   Problem: Coping: Goal: Level of anxiety will decrease 06/12/2022 1737 by Payton Spark, RN Outcome: Progressing 06/12/2022 1705 by Payton Spark, RN Outcome: Progressing   Problem: Elimination: Goal: Will not experience complications related to bowel motility 06/12/2022 1737 by Payton Spark, RN Outcome: Progressing 06/12/2022 1705 by Payton Spark, RN Outcome: Progressing Goal: Will not experience complications related to urinary retention 06/12/2022 1737 by Payton Spark, RN Outcome: Progressing 06/12/2022 1705 by Payton Spark, RN Outcome: Progressing   Problem: Pain Managment: Goal: General experience of comfort will improve 06/12/2022 1737 by Payton Spark, RN Outcome: Progressing 06/12/2022 1705 by Payton Spark, RN Outcome: Progressing   Problem: Safety: Goal: Ability to remain free from injury will improve 06/12/2022 1737 by Payton Spark, RN Outcome: Progressing 06/12/2022 1705 by Payton Spark, RN Outcome: Progressing   Problem: Skin Integrity: Goal: Risk for impaired skin integrity will decrease 06/12/2022 1737 by Payton Spark,  RN Outcome: Progressing 06/12/2022 1705 by Payton Spark, RN Outcome: Progressing   Problem: Activity: Goal: Ability to tolerate increased activity will improve 06/12/2022 1737 by Payton Spark, RN Outcome: Progressing 06/12/2022 1705 by Payton Spark, RN Outcome: Progressing   Problem: Clinical Measurements: Goal: Ability to maintain a body temperature in the normal range will improve 06/12/2022 1737 by Payton Spark, RN Outcome: Progressing 06/12/2022 1705 by Payton Spark, RN Outcome: Progressing   Problem: Respiratory: Goal: Ability to maintain adequate ventilation will improve 06/12/2022 1737 by Payton Spark, RN Outcome: Progressing 06/12/2022 1705 by Payton Spark, RN Outcome: Progressing Goal: Ability to maintain a clear airway will improve 06/12/2022 1737 by Payton Spark, RN Outcome: Progressing 06/12/2022 1705 by Payton Spark, RN Outcome: Progressing

## 2022-06-12 NOTE — Plan of Care (Signed)
  Problem: Education: Goal: Knowledge of disease or condition will improve Outcome: Progressing Goal: Knowledge of the prescribed therapeutic regimen will improve Outcome: Progressing Goal: Individualized Educational Video(s) Outcome: Progressing   Problem: Activity: Goal: Ability to tolerate increased activity will improve Outcome: Progressing Goal: Will verbalize the importance of balancing activity with adequate rest periods Outcome: Progressing   Problem: Respiratory: Goal: Ability to maintain a clear airway will improve Outcome: Progressing Goal: Levels of oxygenation will improve Outcome: Progressing Goal: Ability to maintain adequate ventilation will improve Outcome: Progressing   Problem: Education: Goal: Knowledge of General Education information will improve Description: Including pain rating scale, medication(s)/side effects and non-pharmacologic comfort measures Outcome: Progressing   Problem: Health Behavior/Discharge Planning: Goal: Ability to manage health-related needs will improve Outcome: Progressing   Problem: Clinical Measurements: Goal: Ability to maintain clinical measurements within normal limits will improve Outcome: Progressing Goal: Will remain free from infection Outcome: Progressing Goal: Diagnostic test results will improve Outcome: Progressing Goal: Respiratory complications will improve Outcome: Progressing Goal: Cardiovascular complication will be avoided Outcome: Progressing   Problem: Activity: Goal: Risk for activity intolerance will decrease Outcome: Progressing   Problem: Nutrition: Goal: Adequate nutrition will be maintained Outcome: Progressing   Problem: Coping: Goal: Level of anxiety will decrease Outcome: Progressing   Problem: Elimination: Goal: Will not experience complications related to bowel motility Outcome: Progressing Goal: Will not experience complications related to urinary retention Outcome: Progressing    Problem: Pain Managment: Goal: General experience of comfort will improve Outcome: Progressing   Problem: Safety: Goal: Ability to remain free from injury will improve Outcome: Progressing   Problem: Skin Integrity: Goal: Risk for impaired skin integrity will decrease Outcome: Progressing   Problem: Activity: Goal: Ability to tolerate increased activity will improve Outcome: Progressing   Problem: Clinical Measurements: Goal: Ability to maintain a body temperature in the normal range will improve Outcome: Progressing   Problem: Respiratory: Goal: Ability to maintain adequate ventilation will improve Outcome: Progressing Goal: Ability to maintain a clear airway will improve Outcome: Progressing

## 2022-06-12 NOTE — Progress Notes (Addendum)
PROGRESS NOTE    Kim Howard  TIW:[id number] DOB: 1971-07-01 DOA: 06/11/2022 PCP: Fayrene Helper, NP   Brief Narrative: 51 use, undiagnosed COPD presents complaining of shortness of breath, cough, left chest tenderness, she was found to have influenza A positive.  Lactic acidosis, Acute hypoxic respiratory failure oxygen sat 80% on room air subsequently improved to 98 on 2 L.   Assessment & Plan:   Principal Problem:   Acute hypoxemic respiratory failure (HCC) Active Problems:   Influenza A   Tobacco dependence   Musculoskeletal chest pain   Coughing   Shortness of breath   Diarrhea   1-Acute hypoxic respiratory failure Presumed COPD exacerbation Influenza  A -Chest x-ray: No active disease. -Procalcitonin 0.3.  Lactic acid peaked to 5.1--2.7 -Patient was a started on IV antibiotics to cover for superimposed bacterial pneumonia due to increased procalcitonin and lactic acidosis -MRSA PCR negative,  discontinue vancomycin. -Continue with Tamiflu -Continue with IVF steroids transition to prednisone 12/13 -Start guaifenesin.  Tussionex changed to twice daily as needed. -Start Pulmicort -Continue with DuoNebs -Change Levaquin to Doxy due to prolong QT.   Diarrhea: In the setting of influenza, gastroenteritis Supportive care  Chest pain, musculoskeletal: In the setting of frequent cough.  Changed to CNX to twice daily as needed  Tobacco dependence: Counseling provided  Prolonged QT: Avoid prolonged QT medications DC Levaquin     Estimated body mass index is 25.85 kg/m as calculated from the following:   Height as of this encounter: 5\' 8"  (1.727 m).   Weight as of this encounter: 77.1 kg.   DVT prophylaxis: Lovenox Code Status: Full code Family Communication: Care discussed with patient Disposition Plan:  Status is: Inpatient Remains inpatient appropriate because: Management of influenza and COPD exacerbation    Consultants:  None  Procedures:   None  Antimicrobials:  Levaquin  Subjective: She continues to have chest pain after she coughed, reports persistent cough.  She is breathing a little bit better.  She still requiring oxygen.  Objective: Vitals:   06/12/22 0200 06/12/22 0230 06/12/22 0300 06/12/22 0306  BP:    117/71  Pulse: 68 71 71   Resp: 14 14 14    Temp:      TempSrc:      SpO2: 96% 99% 99% 98%  Weight:      Height:        Intake/Output Summary (Last 24 hours) at 06/12/2022 0730 Last data filed at 06/12/2022 0445 Gross per 24 hour  Intake 2443.1 ml  Output --  Net 2443.1 ml   Filed Weights   06/11/22 1158  Weight: 77.1 kg    Examination:  General exam: Appears calm and comfortable  Respiratory system: bilateral rhonchorous and wheezing Cardiovascular system: S1 & S2 heard, RRR. No JVD, murmurs, rubs, gallops or clicks. No pedal edema. Gastrointestinal system: Abdomen is nondistended, soft and nontender. No organomegaly or masses felt. Normal bowel sounds heard. Central nervous system: Alert and oriented. No focal neurological deficits. Extremities: Symmetric 5 x 5 power.    Data Reviewed: I have personally reviewed following labs and imaging studies  CBC: Recent Labs  Lab 06/11/22 1208 06/12/22 0437  WBC 6.0 9.9  NEUTROABS 3.0  --   HGB 15.1* 12.2  HCT 46.1* 36.9  MCV 92.8 94.6  PLT 252 203   Basic Metabolic Panel: Recent Labs  Lab 06/11/22 1208 06/12/22 0437  NA 136 139  K 3.5 3.9  CL 104 110  CO2 20* 22  GLUCOSE 98  100*  BUN 11 12  CREATININE 1.14* 0.72  CALCIUM 9.1 8.4*  MG 2.1  --   PHOS 2.9  --    GFR: Estimated Creatinine Clearance: 91.9 mL/min (by C-G formula based on SCr of 0.72 mg/dL). Liver Function Tests: Recent Labs  Lab 06/11/22 1208  AST 40  ALT 25  ALKPHOS 75  BILITOT 0.4  PROT 8.6*  ALBUMIN 4.2   No results for input(s): "LIPASE", "AMYLASE" in the last 168 hours. No results for input(s): "AMMONIA" in the last 168 hours. Coagulation  Profile: Recent Labs  Lab 06/11/22 1208  INR 1.1   Cardiac Enzymes: No results for input(s): "CKTOTAL", "CKMB", "CKMBINDEX", "TROPONINI" in the last 168 hours. BNP (last 3 results) No results for input(s): "PROBNP" in the last 8760 hours. HbA1C: No results for input(s): "HGBA1C" in the last 72 hours. CBG: No results for input(s): "GLUCAP" in the last 168 hours. Lipid Profile: No results for input(s): "CHOL", "HDL", "LDLCALC", "TRIG", "CHOLHDL", "LDLDIRECT" in the last 72 hours. Thyroid Function Tests: No results for input(s): "TSH", "T4TOTAL", "FREET4", "T3FREE", "THYROIDAB" in the last 72 hours. Anemia Panel: No results for input(s): "VITAMINB12", "FOLATE", "FERRITIN", "TIBC", "IRON", "RETICCTPCT" in the last 72 hours. Sepsis Labs: Recent Labs  Lab 06/11/22 1208 06/11/22 1945 06/12/22 0300  PROCALCITON 0.30  --   --   LATICACIDVEN 1.9 5.1* 2.7*    Recent Results (from the past 240 hour(s))  Resp panel by RT-PCR (RSV, Flu A&B, Covid) Anterior Nasal Swab     Status: Abnormal   Collection Time: 06/11/22 12:08 PM   Specimen: Anterior Nasal Swab  Result Value Ref Range Status   SARS Coronavirus 2 by RT PCR NEGATIVE NEGATIVE Final    Comment: (NOTE) SARS-CoV-2 target nucleic acids are NOT DETECTED.  The SARS-CoV-2 RNA is generally detectable in upper respiratory specimens during the acute phase of infection. The lowest concentration of SARS-CoV-2 viral copies this assay can detect is 138 copies/mL. A negative result does not preclude SARS-Cov-2 infection and should not be used as the sole basis for treatment or other patient management decisions. A negative result may occur with  improper specimen collection/handling, submission of specimen other than nasopharyngeal swab, presence of viral mutation(s) within the areas targeted by this assay, and inadequate number of viral copies(<138 copies/mL). A negative result must be combined with clinical observations, patient  history, and epidemiological information. The expected result is Negative.  Fact Sheet for Patients:  BloggerCourse.com  Fact Sheet for Healthcare Providers:  SeriousBroker.it  This test is no t yet approved or cleared by the [place] FDA and  has been authorized for detection and/or diagnosis of SARS-CoV-2 by FDA under an Emergency Use Authorization (EUA). This EUA will remain  in effect (meaning this test can be used) for the duration of the COVID-19 declaration under Section 564(b)(1) of the Act, 21 U.S.C.section 360bbb-3(b)(1), unless the authorization is terminated  or revoked sooner.       Influenza A by PCR POSITIVE (A) NEGATIVE Final   Influenza B by PCR NEGATIVE NEGATIVE Final    Comment: (NOTE) The Xpert Xpress SARS-CoV-2/FLU/RSV plus assay is intended as an aid in the diagnosis of influenza from Nasopharyngeal swab specimens and should not be used as a sole basis for treatment. Nasal washings and aspirates are unacceptable for Xpert Xpress SARS-CoV-2/FLU/RSV testing.  Fact Sheet for Patients: BloggerCourse.com  Fact Sheet for Healthcare Providers: SeriousBroker.it  This test is not yet approved or cleared by the [place] FDA and has  been authorized for detection and/or diagnosis of SARS-CoV-2 by FDA under an Emergency Use Authorization (EUA). This EUA will remain in effect (meaning this test can be used) for the duration of the COVID-19 declaration under Section 564(b)(1) of the Act, 21 U.S.C. section 360bbb-3(b)(1), unless the authorization is terminated or revoked.     Resp Syncytial Virus by PCR NEGATIVE NEGATIVE Final    Comment: (NOTE) Fact Sheet for Patients: BloggerCourse.com  Fact Sheet for Healthcare Providers: SeriousBroker.it  This test is not yet approved or cleared by the [place]  FDA and has been authorized for detection and/or diagnosis of SARS-CoV-2 by FDA under an Emergency Use Authorization (EUA). This EUA will remain in effect (meaning this test can be used) for the duration of the COVID-19 declaration under Section 564(b)(1) of the Act, 21 U.S.C. section 360bbb-3(b)(1), unless the authorization is terminated or revoked.  Performed at [facility], [address], [place], [place] [address]          Radiology Studies: [facility] Chest [place] 1 View  Result Date: 06/11/2022 CLINICAL DATA:  Sepsis, cough, dyspnea EXAM: PORTABLE CHEST 1 VIEW COMPARISON:  12/19/2017 chest radiograph. FINDINGS: Stable cardiomediastinal silhouette with normal heart size. No pneumothorax. No pleural effusion. Lungs appear clear, with no acute consolidative airspace disease and no pulmonary edema. IMPRESSION: No active disease. Electronically Signed   By: Delbert Phenix M.D.   On: 06/11/2022 12:43        Scheduled Meds:  enoxaparin (LOVENOX) injection  40 mg Subcutaneous Q24H   famotidine  40 mg Oral q morning   ipratropium-albuterol  3 mL Nebulization QID   lidocaine  1 patch Transdermal Q24H   methylPREDNISolone (SOLU-MEDROL) injection  40 mg Intravenous BID   Followed by   Melene Muller ON 06/13/2022] predniSONE  40 mg Oral Q breakfast   mirtazapine  15 mg Oral QHS   oseltamivir  75 mg Oral BID   oxybutynin  5 mg Oral q morning   rosuvastatin  10 mg Oral QHS   Continuous Infusions:  levofloxacin (LEVAQUIN) IV Stopped (06/11/22 2356)   vancomycin       LOS: 1 day    Time spent: 35 minutes    Shafer Swamy A Khaleah Duer, MD [facility]   If 7PM-7AM, please contact night-coverage [email or web address]  06/12/2022, 7:30 AM

## 2022-06-13 DIAGNOSIS — J9601 Acute respiratory failure with hypoxia: Secondary | ICD-10-CM | POA: Diagnosis not present

## 2022-06-13 LAB — BASIC METABOLIC PANEL
Anion gap: 6 (ref 5–15)
BUN: 13 mg/dL (ref 6–20)
CO2: 26 mmol/L (ref 22–32)
Calcium: 8.7 mg/dL — ABNORMAL LOW (ref 8.9–10.3)
Chloride: 109 mmol/L (ref 98–111)
Creatinine, Ser: 0.73 mg/dL (ref 0.44–1.00)
GFR, Estimated: >60 mL/min (ref >60)
Glucose, Bld: 141 mg/dL — ABNORMAL HIGH (ref 70–99)
Potassium: 3.9 mmol/L (ref 3.5–5.1)
Sodium: 141 mmol/L (ref 135–145)

## 2022-06-13 LAB — MAGNESIUM: Magnesium: 2.3 mg/dL (ref 1.7–2.4)

## 2022-06-13 NOTE — Progress Notes (Signed)
PROGRESS NOTE    Kim Howard  HWE:[id number]  DOB: [birth year]  DOA: 06/11/2022 PCP: Fayrene Helper, NP Outpatient Specialists:   Hospital course:  51 year old female is admitted with COPD exacerbation and found to have influenza A.  She is admitted for hypoxic respiratory failure with O2 saturations of 80% on room air improved to 98 on 2 L.   Subjective:  Patient states she feels much better than she did yesterday and on admission.  Notes she still short of breath but nowhere near shortness of breath as previously.  Notes she continues to feel quite tired but even that is improving.   Objective: Vitals:   06/13/22 0700 06/13/22 0738 06/13/22 1411 06/13/22 1602  BP:  117/75  98/62  Pulse:  74  83  Resp:  16  17  Temp:  97.6 F (36.4 C)  98.4 F (36.9 C)  TempSrc:      SpO2: 100% 100% 100% 94%  Weight:      Height:        Intake/Output Summary (Last 24 hours) at 06/13/2022 1645 Last data filed at 06/13/2022 1607 Gross per 24 hour  Intake 50 ml  Output 400 ml  Net -350 ml   Filed Weights   06/11/22 1158  Weight: 77.1 kg     Exam:  General: Tired appearing female lying in bed in NAD with attentive daughter at bedside Eyes: sclera anicteric, conjuctiva mild injection bilaterally CVS: S1-S2, regular  Respiratory: Reasonable air entry bilaterally with occasional expiratory wheezes in both lung fields GI: NABS, soft, NT  LE: Warm and well-perfused Neuro: A/O x 3,  grossly nonfocal.  Psych: patient is logical and coherent, judgement and insight appear normal, mood and affect appropriate to situation.  Data Reviewed:  Basic Metabolic Panel: Recent Labs  Lab 06/11/22 1208 06/12/22 0434 06/12/22 0437 06/13/22 0125  NA 136  --  139 141  K 3.5  --  3.9 3.9  CL 104  --  110 109  CO2 20*  --  22 26  GLUCOSE 98  --  100* 141*  BUN 11  --  12 13  CREATININE 1.14*  --  0.72 0.73  CALCIUM 9.1  --  8.4* 8.7*  MG 2.1 1.7  --  2.3  PHOS 2.9  --   --    --     CBC: Recent Labs  Lab 06/11/22 1208 06/12/22 0437  WBC 6.0 9.9  NEUTROABS 3.0  --   HGB 15.1* 12.2  HCT 46.1* 36.9  MCV 92.8 94.6  PLT 252 203     Scheduled Meds:  budesonide (PULMICORT) nebulizer solution  0.25 mg Nebulization BID   doxycycline  100 mg Oral Q12H   enoxaparin (LOVENOX) injection  40 mg Subcutaneous Q24H   famotidine  40 mg Oral q morning   guaiFENesin  600 mg Oral BID   ipratropium-albuterol  3 mL Nebulization Q6H   lidocaine  1 patch Transdermal Q24H   mirtazapine  15 mg Oral QHS   oseltamivir  75 mg Oral BID   oxybutynin  5 mg Oral q morning   pantoprazole  40 mg Oral Daily   predniSONE  40 mg Oral Q breakfast   rosuvastatin  10 mg Oral QHS   Continuous Infusions:   Assessment & Plan:   Acute hypoxic respiratory failure secondary to COPD exacerbation and influenza A Patient is improving with Tamiflu, steroids and inhaled bronchodilators Procalcitonin was 0.03, patient started on doxycycline day #2 today  Continue present management  Diarrhea Improving  QT prolongation Levaquin was discontinued Potassium and magnesium are at goal Recheck QT interval tomorrow, EKG ordered  Tobacco dependence Patient advised to stop smoking    DVT prophylaxis: Lovenox Code Status: Full Family Communication: Patient's daughter was at bedside throughout Disposition Plan:   Patient is from: Home  Anticipated Discharge Location:  home  Studies: No results found.  Principal Problem:   Acute hypoxemic respiratory failure (HCC) Active Problems:   Influenza A   Tobacco dependence   Musculoskeletal chest pain   Coughing   Shortness of breath   Diarrhea     Kim Howard [name], [facility]  If 7PM-7AM, please contact night-coverage [email or web address]   LOS: 2 days

## 2022-06-13 NOTE — Plan of Care (Signed)
  Problem: Respiratory: Goal: Ability to maintain a clear airway will improve Outcome: Progressing   Problem: Elimination: Goal: Will not experience complications related to bowel motility Outcome: Progressing   Problem: Coping: Goal: Level of anxiety will decrease Outcome: Progressing   Problem: Nutrition: Goal: Adequate nutrition will be maintained Outcome: Progressing   Problem: Activity: Goal: Risk for activity intolerance will decrease  Problem: Clinical Measurements: Goal: Ability to maintain a body temperature in the normal range will improve Outcome: Progressing

## 2022-06-13 NOTE — Plan of Care (Signed)
  Problem: Education: Goal: Knowledge of General Education information will improve Description: Including pain rating scale, medication(s)/side effects and non-pharmacologic comfort measures Outcome: Progressing   Problem: Health Behavior/Discharge Planning: Goal: Ability to manage health-related needs will improve Outcome: Progressing   Problem: Clinical Measurements: Goal: Ability to maintain clinical measurements within normal limits will improve Outcome: Progressing Goal: Diagnostic test results will improve Outcome: Progressing Goal: Respiratory complications will improve Outcome: Progressing Goal: Cardiovascular complication will be avoided Outcome: Progressing   Problem: Activity: Goal: Risk for activity intolerance will decrease Outcome: Progressing   Problem: Nutrition: Goal: Adequate nutrition will be maintained Outcome: Progressing   Problem: Coping: Goal: Level of anxiety will decrease Outcome: Progressing   Problem: Elimination: Goal: Will not experience complications related to bowel motility Outcome: Progressing Goal: Will not experience complications related to urinary retention Outcome: Progressing   

## 2022-06-14 MED ORDER — METHYLPREDNISOLONE SODIUM SUCC 40 MG IJ SOLR
40.0000 mg | Freq: Two times a day (BID) | INTRAMUSCULAR | Status: DC
  Administered 2022-06-14: 40 mg via INTRAVENOUS
  Filled 2022-06-14: qty 1

## 2022-06-14 MED ORDER — PREDNISONE 20 MG PO TABS
40.0000 mg | ORAL_TABLET | Freq: Every day | ORAL | Status: DC
  Administered 2022-06-15: 40 mg via ORAL
  Filled 2022-06-14: qty 2

## 2022-06-14 MED ORDER — IPRATROPIUM-ALBUTEROL 0.5-2.5 (3) MG/3ML IN SOLN
3.0000 mL | Freq: Three times a day (TID) | RESPIRATORY_TRACT | Status: DC
  Administered 2022-06-15: 3 mL via RESPIRATORY_TRACT
  Filled 2022-06-14: qty 3

## 2022-06-14 NOTE — Progress Notes (Signed)
CSW called Terri, [employer], to answer any questions about patient discharge.  CSW informed Terri that there are no HH orders of any thing follow-up for TOC at this point.  CSW will continue to follow along in case there are any TOC updates.  This information relayed to Terri.  [address] [place], LCSW [phone]

## 2022-06-14 NOTE — Progress Notes (Signed)
  Transition of Care (TOC) Screening Note   Patient Details  Name: MARYFER TAUZIN Date of Birth: [birth year]   Transition of Care [facility]) CM/SW Contact:    Colin Broach, LCSW Phone Number: 06/14/2022, 10:32 AM    Transition of Care Department [facility]) has reviewed patient and no TOC needs have been identified at this time. We will continue to monitor patient advancement through interdisciplinary progression rounds. If new patient transition needs arise, please place a TOC consult.

## 2022-06-14 NOTE — Plan of Care (Signed)

## 2022-06-14 NOTE — Progress Notes (Signed)
PROGRESS NOTE    AMMI HUTT  WUJ:[id number] DOB: 1971/06/09 DOA: 06/11/2022 PCP: Fayrene Helper, NP    Brief Narrative:  51 year old female with history of smoking but no formal diagnosis of COPD presented to the emergency room with wheezing and shortness of breath as well low-grade fever from home for 2 to 3 days.  In the emergency room she was found to have influenza positive and extensive wheezing and admitted to the hospital for treatment.  Initially on oxygen and now improved.   Assessment & Plan:   Acute hypoxemic respite failure secondary to COPD exacerbation due to influenza A infection. Currently on room air.  Hypoxemia resolved. Patient continues to have persistent wheezing and worsening bronchospasm. Agree with monitoring in the hospital given persistent symptoms.  Aggressive bronchodilator therapy, IV steroids, inhalational steroids,  deep breathing exercises, incentive spirometry, chest physiotherapy. Tamiflu day 4/5 Antibiotics due to severity of symptoms.  Doxycycline day 3/5 Mobilize.  Smoker: Counseled to quit.  Nicotine patch ordered.  Hyperlipidemia: On statin.   DVT prophylaxis: enoxaparin (LOVENOX) injection 40 mg Start: 06/11/22 2200 Place TED hose Start: 06/11/22 1443   Code Status: Full code Family Communication: None at the bedside Disposition Plan: Status is: Inpatient Remains inpatient appropriate because: Persistent wheezing.  Change to MedSurg bed.  Mobilize.     Consultants:  None  Procedures:  None  Antimicrobials:  Tamiflu day 4/5 Doxycycline days 2/5   Subjective: Patient seen and examined.  She was having nagging cough and wheezing with minimal blackish sputum production.  Afebrile.  Able to come off oxygen.  Anxious with coughing. Completed oral steroids, will start some IV steroids today.  Objective: Vitals:   06/13/22 2336 06/14/22 0100 06/14/22 0742 06/14/22 0745  BP: 112/78   (!) 123/106  Pulse: 61   75  Resp:  20     Temp: 97.8 F (36.6 C)   98 F (36.7 C)  TempSrc:      SpO2: 98% 98% 95% 97%  Weight:      Height:        Intake/Output Summary (Last 24 hours) at 06/14/2022 1312 Last data filed at 06/13/2022 1700 Gross per 24 hour  Intake --  Output 600 ml  Net -600 ml   Filed Weights   06/11/22 1158  Weight: 77.1 kg    Examination:  General exam: Appears anxious while coughing.  At rest, not in any distress. Respiratory system: Extensive expiratory wheezes and upper airway sounds.  Nagging cough present. Cardiovascular system: S1 & S2 heard, RRR. No JVD, murmurs, rubs, gallops or clicks. No pedal edema. Gastrointestinal system: Abdomen is nondistended, soft and nontender. No organomegaly or masses felt. Normal bowel sounds heard. Central nervous system: Alert and oriented. No focal neurological deficits. Extremities: Symmetric 5 x 5 power.     Data Reviewed: I have personally reviewed following labs and imaging studies  CBC: Recent Labs  Lab 06/11/22 1208 06/12/22 0437  WBC 6.0 9.9  NEUTROABS 3.0  --   HGB 15.1* 12.2  HCT 46.1* 36.9  MCV 92.8 94.6  PLT 252 203   Basic Metabolic Panel: Recent Labs  Lab 06/11/22 1208 06/12/22 0434 06/12/22 0437 06/13/22 0125  NA 136  --  139 141  K 3.5  --  3.9 3.9  CL 104  --  110 109  CO2 20*  --  22 26  GLUCOSE 98  --  100* 141*  BUN 11  --  12 13  CREATININE 1.14*  --  0.72 0.73  CALCIUM 9.1  --  8.4* 8.7*  MG 2.1 1.7  --  2.3  PHOS 2.9  --   --   --    GFR: Estimated Creatinine Clearance: 91.9 mL/min (by C-G formula based on SCr of 0.73 mg/dL). Liver Function Tests: Recent Labs  Lab 06/11/22 1208  AST 40  ALT 25  ALKPHOS 75  BILITOT 0.4  PROT 8.6*  ALBUMIN 4.2   No results for input(s): "LIPASE", "AMYLASE" in the last 168 hours. No results for input(s): "AMMONIA" in the last 168 hours. Coagulation Profile: Recent Labs  Lab 06/11/22 1208  INR 1.1   Cardiac Enzymes: No results for input(s): "CKTOTAL",  "CKMB", "CKMBINDEX", "TROPONINI" in the last 168 hours. BNP (last 3 results) No results for input(s): "PROBNP" in the last 8760 hours. HbA1C: No results for input(s): "HGBA1C" in the last 72 hours. CBG: No results for input(s): "GLUCAP" in the last 168 hours. Lipid Profile: No results for input(s): "CHOL", "HDL", "LDLCALC", "TRIG", "CHOLHDL", "LDLDIRECT" in the last 72 hours. Thyroid Function Tests: No results for input(s): "TSH", "T4TOTAL", "FREET4", "T3FREE", "THYROIDAB" in the last 72 hours. Anemia Panel: No results for input(s): "VITAMINB12", "FOLATE", "FERRITIN", "TIBC", "IRON", "RETICCTPCT" in the last 72 hours. Sepsis Labs: Recent Labs  Lab 06/11/22 1208 06/11/22 1945 06/12/22 0300  PROCALCITON 0.30  --   --   LATICACIDVEN 1.9 5.1* 2.7*    Recent Results (from the past 240 hour(s))  Blood Culture (routine x 2)     Status: None (Preliminary result)   Collection Time: 06/11/22 12:08 PM   Specimen: BLOOD RIGHT ARM  Result Value Ref Range Status   Specimen Description BLOOD RIGHT ARM  Final   Special Requests   Final    BOTTLES DRAWN AEROBIC AND ANAEROBIC Blood Culture results may not be optimal due to an excessive volume of blood received in culture bottles   Culture   Final    NO GROWTH 3 DAYS Performed at [facility], [address], [place], [place] [address]    Report Status PENDING  Incomplete  Blood Culture (routine x 2)     Status: None (Preliminary result)   Collection Time: 06/11/22 12:08 PM   Specimen: BLOOD LEFT WRIST  Result Value Ref Range Status   Specimen Description BLOOD LEFT WRIST  Final   Special Requests   Final    BOTTLES DRAWN AEROBIC AND ANAEROBIC Blood Culture adequate volume   Culture   Final    NO GROWTH 3 DAYS Performed at [facility], [address], [place], [place] [address]    Report Status PENDING  Incomplete  Resp panel by RT-PCR (RSV, Flu A&B, Covid) Anterior Nasal Swab     Status: Abnormal   Collection Time:  06/11/22 12:08 PM   Specimen: Anterior Nasal Swab  Result Value Ref Range Status   SARS Coronavirus 2 by RT PCR NEGATIVE NEGATIVE Final    Comment: (NOTE) SARS-CoV-2 target nucleic acids are NOT DETECTED.  The SARS-CoV-2 RNA is generally detectable in upper respiratory specimens during the acute phase of infection. The lowest concentration of SARS-CoV-2 viral copies this assay can detect is 138 copies/mL. A negative result does not preclude SARS-Cov-2 infection and should not be used as the sole basis for treatment or other patient management decisions. A negative result may occur with  improper specimen collection/handling, submission of specimen other than nasopharyngeal swab, presence of viral mutation(s) within the areas targeted by this assay, and inadequate number of viral  copies(<138 copies/mL). A negative result must be combined with clinical observations, patient history, and epidemiological information. The expected result is Negative.  Fact Sheet for Patients:  BloggerCourse.comhttps://www.fda.gov/media/152166/download  Fact Sheet for Healthcare Providers:  SeriousBroker.ithttps://www.fda.gov/media/152162/download  This test is no t yet approved or cleared by the [place] FDA and  has been authorized for detection and/or diagnosis of SARS-CoV-2 by FDA under an Emergency Use Authorization (EUA). This EUA will remain  in effect (meaning this test can be used) for the duration of the COVID-19 declaration under Section 564(b)(1) of the Act, 21 U.S.C.section 360bbb-3(b)(1), unless the authorization is terminated  or revoked sooner.       Influenza A by PCR POSITIVE (A) NEGATIVE Final   Influenza B by PCR NEGATIVE NEGATIVE Final    Comment: (NOTE) The Xpert Xpress SARS-CoV-2/FLU/RSV plus assay is intended as an aid in the diagnosis of influenza from Nasopharyngeal swab specimens and should not be used as a sole basis for treatment. Nasal washings and aspirates are unacceptable for Xpert Xpress  SARS-CoV-2/FLU/RSV testing.  Fact Sheet for Patients: BloggerCourse.comhttps://www.fda.gov/media/152166/download  Fact Sheet for Healthcare Providers: SeriousBroker.ithttps://www.fda.gov/media/152162/download  This test is not yet approved or cleared by the [place] FDA and has been authorized for detection and/or diagnosis of SARS-CoV-2 by FDA under an Emergency Use Authorization (EUA). This EUA will remain in effect (meaning this test can be used) for the duration of the COVID-19 declaration under Section 564(b)(1) of the Act, 21 U.S.C. section 360bbb-3(b)(1), unless the authorization is terminated or revoked.     Resp Syncytial Virus by PCR NEGATIVE NEGATIVE Final    Comment: (NOTE) Fact Sheet for Patients: BloggerCourse.comhttps://www.fda.gov/media/152166/download  Fact Sheet for Healthcare Providers: SeriousBroker.ithttps://www.fda.gov/media/152162/download  This test is not yet approved or cleared by the [place] FDA and has been authorized for detection and/or diagnosis of SARS-CoV-2 by FDA under an Emergency Use Authorization (EUA). This EUA will remain in effect (meaning this test can be used) for the duration of the COVID-19 declaration under Section 564(b)(1) of the Act, 21 U.S.C. section 360bbb-3(b)(1), unless the authorization is terminated or revoked.  Performed at [facility], [address] [place], [place] [address]   MRSA Next Gen by PCR, Nasal     Status: None   Collection Time: 06/12/22  9:50 AM   Specimen: Nasal Mucosa; Nasal Swab  Result Value Ref Range Status   MRSA by PCR Next Gen NOT DETECTED NOT DETECTED Final    Comment: (NOTE) The GeneXpert MRSA Assay (FDA approved for NASAL specimens only), is one component of a comprehensive MRSA colonization surveillance program. It is not intended to diagnose MRSA infection nor to guide or monitor treatment for MRSA infections. Test performance is not FDA approved in patients less than 133 years old. Performed at [facility], [address] [place], [place] [address]          Radiology Studies: No results found.      Scheduled Meds:  budesonide (PULMICORT) nebulizer solution  0.25 mg Nebulization BID   doxycycline  100 mg Oral Q12H   enoxaparin (LOVENOX) injection  40 mg Subcutaneous Q24H   famotidine  40 mg Oral q morning   guaiFENesin  600 mg Oral BID   ipratropium-albuterol  3 mL Nebulization Q6H   methylPREDNISolone (SOLU-MEDROL) injection  40 mg Intravenous Q12H   mirtazapine  15 mg Oral QHS   oseltamivir  75 mg Oral BID   oxybutynin  5 mg Oral q morning   pantoprazole  40 mg  Oral Daily   rosuvastatin  10 mg Oral QHS   Continuous Infusions:   LOS: 3 days    Time spent: 35 minutes    Dorcas Carrow, MD [facility] Pager [phone]

## 2022-06-15 MED ORDER — PREDNISONE 20 MG PO TABS: 10.0000 mg | ORAL_TABLET | Freq: Every day | ORAL | 0 refills | Status: AC

## 2022-06-15 MED ORDER — ALBUTEROL SULFATE HFA 108 (90 BASE) MCG/ACT IN AERS: 2.0000 | INHALATION_SPRAY | Freq: Four times a day (QID) | RESPIRATORY_TRACT | 2 refills | Status: AC | PRN

## 2022-06-15 MED ORDER — ALBUTEROL SULFATE (2.5 MG/3ML) 0.083% IN NEBU: 2.5000 mg | INHALATION_SOLUTION | Freq: Four times a day (QID) | RESPIRATORY_TRACT | 1 refills | Status: DC | PRN

## 2022-06-15 NOTE — Discharge Summary (Signed)
Physician Discharge Summary  Kim Howard [id number] DOB: February 10, 1971 DOA: 06/11/2022  PCP: Danelle Berry, NP  Admit date: 06/11/2022 Discharge date: 06/15/2022  Admitted From: Home Disposition: Home  Recommendations for Outpatient Follow-up:  Follow up with PCP in 1-2 weeks Stop smoking, seek further resources.  Use nicotine patch.  Home Health: N/A Equipment/Devices: N/A  Discharge Condition: Stable CODE STATUS: Full code Diet recommendation: Low-salt diet  Discharge summary: 51 year old female with history of smoking but no formal diagnosis of COPD presented with 3 days of cough, wheezing, low-grade fever.  She was found to be having extensive wheezing, influenza A positive.  She initially required oxygen to keep saturations more than 90%.  She was admitted to the hospital due to acute hypoxemic respiratory failure secondary to wheezy bronchitis and suspected COPD exacerbation due to influenza A infection.  Treated with IV steroids and subsequently oral steroids, bronchodilator therapy, steroid inhalers and chest physiotherapy.  Patient received 5 days of Tamiflu, 4 days of doxycycline. She is asymptomatic today.  Occasional dry cough but no other symptoms.  Plan: Rescue inhaler and albuterol nebulizer as needed. Short course of prednisone taper, 3 additional days 10 mg daily. No indication to continue antibiotics or antiviral. Smoking cessation counseling done, patient is motivated to quit. She will benefit with PFTs when symptoms improved and follow-up with primary care physician.  Chronic medical issues are stable.  No change in medications were done.  Stable for discharge.   Discharge Diagnoses:  Principal Problem:   Acute hypoxemic respiratory failure (HCC) Active Problems:   Influenza A   Tobacco dependence   Musculoskeletal chest pain   Coughing   Shortness of breath   Diarrhea    Discharge Instructions  Discharge Instructions     Diet - low  sodium heart healthy   Complete by: As directed    Increase activity slowly   Complete by: As directed       Allergies as of 06/15/2022       Reactions   Penicillins Hives, Swelling   Has patient had a PCN reaction causing immediate rash, facial/tongue/throat swelling, SOB or lightheadedness with hypotension: Yes Has patient had a PCN reaction causing severe rash involving mucus membranes or skin necrosis: No Has patient had a PCN reaction that required hospitalization: Unknown Has patient had a PCN reaction occurring within the last 10 years: No If all of the above answers are "NO", then may proceed with Cephalosporin use.        Medication List     STOP taking these medications    bisacodyl 5 MG EC tablet Commonly known as: DULCOLAX   doxycycline 100 MG tablet Commonly known as: VIBRA-TABS   etonogestrel 68 MG Impl implant Commonly known as: NEXPLANON   ibuprofen 600 MG tablet Commonly known as: ADVIL   omeprazole 40 MG capsule Commonly known as: PRILOSEC   ondansetron 4 MG disintegrating tablet Commonly known as: Zofran ODT   ondansetron 4 MG tablet Commonly known as: ZOFRAN   promethazine 25 MG suppository Commonly known as: PHENERGAN   QUEtiapine 100 MG tablet Commonly known as: SEROQUEL   sucralfate 1 GM/10ML suspension Commonly known as: Carafate   terbinafine 250 MG tablet Commonly known as: LamISIL       TAKE these medications    albuterol 108 (90 Base) MCG/ACT inhaler Commonly known as: VENTOLIN HFA Inhale 2 puffs into the lungs every 6 (six) hours as needed for wheezing or shortness of breath.   albuterol (2.5 MG/3ML) 0.083%  nebulizer solution Commonly known as: PROVENTIL Take 3 mLs (2.5 mg total) by nebulization every 6 (six) hours as needed for wheezing or shortness of breath.   CYCLOBENZAPRINE HCL ER PO Take by mouth daily.   dicyclomine 10 MG capsule Commonly known as: BENTYL Take 10 mg by mouth 4 (four) times daily.    famotidine 40 MG tablet Commonly known as: PEPCID Take by mouth.   gabapentin 100 MG capsule Commonly known as: NEURONTIN Take 100 mg by mouth 2 (two) times daily.   lurasidone 40 MG Tabs tablet Commonly known as: LATUDA Take 40 mg by mouth daily.   mirtazapine 15 MG tablet Commonly known as: REMERON Take 15 mg by mouth at bedtime.   oxybutynin 5 MG tablet Commonly known as: DITROPAN Take 5 mg by mouth every morning.   predniSONE 20 MG tablet Commonly known as: DELTASONE Take 0.5 tablets (10 mg total) by mouth daily for 3 days.   rosuvastatin 10 MG tablet Commonly known as: CRESTOR Take 10 mg by mouth at bedtime.        Allergies  Allergen Reactions   Penicillins Hives and Swelling    Has patient had a PCN reaction causing immediate rash, facial/tongue/throat swelling, SOB or lightheadedness with hypotension: Yes Has patient had a PCN reaction causing severe rash involving mucus membranes or skin necrosis: No Has patient had a PCN reaction that required hospitalization: Unknown Has patient had a PCN reaction occurring within the last 10 years: No If all of the above answers are "NO", then may proceed with Cephalosporin use.    Consultations: None   Procedures/Studies: DG Chest Port 1 View  Result Date: 06/11/2022 CLINICAL DATA:  Sepsis, cough, dyspnea EXAM: PORTABLE CHEST 1 VIEW COMPARISON:  12/19/2017 chest radiograph. FINDINGS: Stable cardiomediastinal silhouette with normal heart size. No pneumothorax. No pleural effusion. Lungs appear clear, with no acute consolidative airspace disease and no pulmonary edema. IMPRESSION: No active disease. Electronically Signed   By: Ilona Sorrel M.D.   On: 06/11/2022 12:43   (Echo, Carotid, EGD, Colonoscopy, ERCP)    Subjective: Patient seen and examined.  Since last evening her symptoms improved.  Today she is up about walking.  Occasional dry cough but no wheezing or shortness of breath.  Eager to go home.   Discharge  Exam: Vitals:   06/14/22 2323 06/15/22 0759  BP: 124/88 (!) 135/102  Pulse: 72 74  Resp: 20   Temp: 98 F (36.7 C) 97.6 F (36.4 C)  SpO2: 100% 100%   Vitals:   06/14/22 1654 06/14/22 2002 06/14/22 2323 06/15/22 0759  BP: 117/88  124/88 (!) 135/102  Pulse: 81  72 74  Resp:   20   Temp: 97.7 F (36.5 C)  98 F (36.7 C) 97.6 F (36.4 C)  TempSrc:      SpO2: 99% 98% 100% 100%  Weight:      Height:        General: Pt is alert, awake, not in acute distress Cardiovascular: RRR, S1/S2 +, no rubs, no gallops Respiratory: CTA bilaterally, no wheezing, no rhonchi, no added sounds. Abdominal: Soft, NT, ND, bowel sounds + Extremities: no edema, no cyanosis    The results of significant diagnostics from this hospitalization (including imaging, microbiology, ancillary and laboratory) are listed below for reference.     Microbiology: Recent Results (from the past 240 hour(s))  Blood Culture (routine x 2)     Status: None (Preliminary result)   Collection Time: 06/11/22 12:08 PM   Specimen:  BLOOD RIGHT ARM  Result Value Ref Range Status   Specimen Description BLOOD RIGHT ARM  Final   Special Requests   Final    BOTTLES DRAWN AEROBIC AND ANAEROBIC Blood Culture results may not be optimal due to an excessive volume of blood received in culture bottles   Culture   Final    NO GROWTH 4 DAYS Performed at [facility], [address] [place], [place] [address]    Report Status PENDING  Incomplete  Blood Culture (routine x 2)     Status: None (Preliminary result)   Collection Time: 06/11/22 12:08 PM   Specimen: BLOOD LEFT WRIST  Result Value Ref Range Status   Specimen Description BLOOD LEFT WRIST  Final   Special Requests   Final    BOTTLES DRAWN AEROBIC AND ANAEROBIC Blood Culture adequate volume   Culture   Final    NO GROWTH 4 DAYS Performed at [facility], [address] [place],  [address]    Report Status PENDING  Incomplete  Resp panel by  RT-PCR (RSV, Flu A&B, Covid) Anterior Nasal Swab     Status: Abnormal   Collection Time: 06/11/22 12:08 PM   Specimen: Anterior Nasal Swab  Result Value Ref Range Status   SARS Coronavirus 2 by RT PCR NEGATIVE NEGATIVE Final    Comment: (NOTE) SARS-CoV-2 target nucleic acids are NOT DETECTED.  The SARS-CoV-2 RNA is generally detectable in upper respiratory specimens during the acute phase of infection. The lowest concentration of SARS-CoV-2 viral copies this assay can detect is 138 copies/mL. A negative result does not preclude SARS-Cov-2 infection and should not be used as the sole basis for treatment or other patient management decisions. A negative result may occur with  improper specimen collection/handling, submission of specimen other than nasopharyngeal swab, presence of viral mutation(s) within the areas targeted by this assay, and inadequate number of viral copies(<138 copies/mL). A negative result must be combined with clinical observations, patient history, and epidemiological information. The expected result is Negative.  Fact Sheet for Patients:  EntrepreneurPulse.com.au  Fact Sheet for Healthcare Providers:  IncredibleEmployment.be  This test is no t yet approved or cleared by the [place] FDA and  has been authorized for detection and/or diagnosis of SARS-CoV-2 by FDA under an Emergency Use Authorization (EUA). This EUA will remain  in effect (meaning this test can be used) for the duration of the COVID-19 declaration under Section 564(b)(1) of the Act, 21 U.S.C.section 360bbb-3(b)(1), unless the authorization is terminated  or revoked sooner.       Influenza A by PCR POSITIVE (A) NEGATIVE Final   Influenza B by PCR NEGATIVE NEGATIVE Final    Comment: (NOTE) The Xpert Xpress SARS-CoV-2/FLU/RSV plus assay is intended as an aid in the diagnosis of influenza from Nasopharyngeal swab specimens and should not be used as a  sole basis for treatment. Nasal washings and aspirates are unacceptable for Xpert Xpress SARS-CoV-2/FLU/RSV testing.  Fact Sheet for Patients: EntrepreneurPulse.com.au  Fact Sheet for Healthcare Providers: IncredibleEmployment.be  This test is not yet approved or cleared by the [place] FDA and has been authorized for detection and/or diagnosis of SARS-CoV-2 by FDA under an Emergency Use Authorization (EUA). This EUA will remain in effect (meaning this test can be used) for the duration of the COVID-19 declaration under Section 564(b)(1) of the Act, 21 U.S.C. section 360bbb-3(b)(1), unless the authorization is terminated or revoked.     Resp Syncytial Virus by PCR NEGATIVE NEGATIVE Final  Comment: (NOTE) Fact Sheet for Patients: EntrepreneurPulse.com.au  Fact Sheet for Healthcare Providers: IncredibleEmployment.be  This test is not yet approved or cleared by the [place] FDA and has been authorized for detection and/or diagnosis of SARS-CoV-2 by FDA under an Emergency Use Authorization (EUA). This EUA will remain in effect (meaning this test can be used) for the duration of the COVID-19 declaration under Section 564(b)(1) of the Act, 21 U.S.C. section 360bbb-3(b)(1), unless the authorization is terminated or revoked.  Performed at [facility], [address] [place], [place] [address]   MRSA Next Gen by PCR, Nasal     Status: None   Collection Time: 06/12/22  9:50 AM   Specimen: Nasal Mucosa; Nasal Swab  Result Value Ref Range Status   MRSA by PCR Next Gen NOT DETECTED NOT DETECTED Final    Comment: (NOTE) The GeneXpert MRSA Assay (FDA approved for NASAL specimens only), is one component of a comprehensive MRSA colonization surveillance program. It is not intended to diagnose MRSA infection nor to guide or monitor treatment for MRSA infections. Test performance is not FDA  approved in patients less than 74 years old. Performed at [facility], [address] [place], [place] [address]      Labs: BNP (last 3 results) No results for input(s): "BNP" in the last 8760 hours. Basic Metabolic Panel: Recent Labs  Lab 06/11/22 1208 06/12/22 0434 06/12/22 0437 06/13/22 0125  NA 136  --  139 141  K 3.5  --  3.9 3.9  CL 104  --  110 109  CO2 20*  --  22 26  GLUCOSE 98  --  100* 141*  BUN 11  --  12 13  CREATININE 1.14*  --  0.72 0.73  CALCIUM 9.1  --  8.4* 8.7*  MG 2.1 1.7  --  2.3  PHOS 2.9  --   --   --    Liver Function Tests: Recent Labs  Lab 06/11/22 1208  AST 40  ALT 25  ALKPHOS 75  BILITOT 0.4  PROT 8.6*  ALBUMIN 4.2   No results for input(s): "LIPASE", "AMYLASE" in the last 168 hours. No results for input(s): "AMMONIA" in the last 168 hours. CBC: Recent Labs  Lab 06/11/22 1208 06/12/22 0437  WBC 6.0 9.9  NEUTROABS 3.0  --   HGB 15.1* 12.2  HCT 46.1* 36.9  MCV 92.8 94.6  PLT 252 203   Cardiac Enzymes: No results for input(s): "CKTOTAL", "CKMB", "CKMBINDEX", "TROPONINI" in the last 168 hours. BNP: Invalid input(s): "POCBNP" CBG: No results for input(s): "GLUCAP" in the last 168 hours. D-Dimer No results for input(s): "DDIMER" in the last 72 hours. Hgb A1c No results for input(s): "HGBA1C" in the last 72 hours. Lipid Profile No results for input(s): "CHOL", "HDL", "LDLCALC", "TRIG", "CHOLHDL", "LDLDIRECT" in the last 72 hours. Thyroid function studies No results for input(s): "TSH", "T4TOTAL", "T3FREE", "THYROIDAB" in the last 72 hours.  Invalid input(s): "FREET3" Anemia work up No results for input(s): "VITAMINB12", "FOLATE", "FERRITIN", "TIBC", "IRON", "RETICCTPCT" in the last 72 hours. Urinalysis    Component Value Date/Time   COLORURINE AMBER (A) 12/19/2017 2025   APPEARANCEUR CLEAR (A) 12/19/2017 2025   APPEARANCEUR Hazy 02/05/2014 1106   LABSPEC 1.026 12/19/2017 2025   LABSPEC 1.029 02/05/2014 1106    PHURINE 6.0 12/19/2017 2025   GLUCOSEU NEGATIVE 12/19/2017 2025   GLUCOSEU Negative 02/05/2014 1106   HGBUR NEGATIVE 12/19/2017 2025   BILIRUBINUR NEGATIVE 12/19/2017 2025   BILIRUBINUR Negative  02/05/2014 1106   Mark 12/19/2017 2025   PROTEINUR 30 (A) 12/19/2017 2025   NITRITE NEGATIVE 12/19/2017 2025   LEUKOCYTESUR TRACE (A) 12/19/2017 2025   LEUKOCYTESUR Negative 02/05/2014 1106   Sepsis Labs Recent Labs  Lab 06/11/22 1208 06/12/22 0437  WBC 6.0 9.9   Microbiology Recent Results (from the past 240 hour(s))  Blood Culture (routine x 2)     Status: None (Preliminary result)   Collection Time: 06/11/22 12:08 PM   Specimen: BLOOD RIGHT ARM  Result Value Ref Range Status   Specimen Description BLOOD RIGHT ARM  Final   Special Requests   Final    BOTTLES DRAWN AEROBIC AND ANAEROBIC Blood Culture results may not be optimal due to an excessive volume of blood received in culture bottles   Culture   Final    NO GROWTH 4 DAYS Performed at [facility], [address] [place], [place] [address]    Report Status PENDING  Incomplete  Blood Culture (routine x 2)     Status: None (Preliminary result)   Collection Time: 06/11/22 12:08 PM   Specimen: BLOOD LEFT WRIST  Result Value Ref Range Status   Specimen Description BLOOD LEFT WRIST  Final   Special Requests   Final    BOTTLES DRAWN AEROBIC AND ANAEROBIC Blood Culture adequate volume   Culture   Final    NO GROWTH 4 DAYS Performed at [facility], [address] [place], [place] [address]    Report Status PENDING  Incomplete  Resp panel by RT-PCR (RSV, Flu A&B, Covid) Anterior Nasal Swab     Status: Abnormal   Collection Time: 06/11/22 12:08 PM   Specimen: Anterior Nasal Swab  Result Value Ref Range Status   SARS Coronavirus 2 by RT PCR NEGATIVE NEGATIVE Final    Comment: (NOTE) SARS-CoV-2 target nucleic acids are NOT DETECTED.  The SARS-CoV-2 RNA is generally detectable in upper  respiratory specimens during the acute phase of infection. The lowest concentration of SARS-CoV-2 viral copies this assay can detect is 138 copies/mL. A negative result does not preclude SARS-Cov-2 infection and should not be used as the sole basis for treatment or other patient management decisions. A negative result may occur with  improper specimen collection/handling, submission of specimen other than nasopharyngeal swab, presence of viral mutation(s) within the areas targeted by this assay, and inadequate number of viral copies(<138 copies/mL). A negative result must be combined with clinical observations, patient history, and epidemiological information. The expected result is Negative.  Fact Sheet for Patients:  EntrepreneurPulse.com.au  Fact Sheet for Healthcare Providers:  IncredibleEmployment.be  This test is no t yet approved or cleared by the [place] FDA and  has been authorized for detection and/or diagnosis of SARS-CoV-2 by FDA under an Emergency Use Authorization (EUA). This EUA will remain  in effect (meaning this test can be used) for the duration of the COVID-19 declaration under Section 564(b)(1) of the Act, 21 U.S.C.section 360bbb-3(b)(1), unless the authorization is terminated  or revoked sooner.       Influenza A by PCR POSITIVE (A) NEGATIVE Final   Influenza B by PCR NEGATIVE NEGATIVE Final    Comment: (NOTE) The Xpert Xpress SARS-CoV-2/FLU/RSV plus assay is intended as an aid in the diagnosis of influenza from Nasopharyngeal swab specimens and should not be used as a sole basis for treatment. Nasal washings and aspirates are unacceptable for Xpert Xpress SARS-CoV-2/FLU/RSV testing.  Fact Sheet for Patients: EntrepreneurPulse.com.au  Fact Sheet for Healthcare  Providers: SeriousBroker.it  This test is not yet approved or cleared by the [place] and has been  authorized for detection and/or diagnosis of SARS-CoV-2 by FDA under an Emergency Use Authorization (EUA). This EUA will remain in effect (meaning this test can be used) for the duration of the COVID-19 declaration under Section 564(b)(1) of the Act, 21 U.S.C. section 360bbb-3(b)(1), unless the authorization is terminated or revoked.     Resp Syncytial Virus by PCR NEGATIVE NEGATIVE Final    Comment: (NOTE) Fact Sheet for Patients: BloggerCourse.com  Fact Sheet for Healthcare Providers: SeriousBroker.it  This test is not yet approved or cleared by the [place] FDA and has been authorized for detection and/or diagnosis of SARS-CoV-2 by FDA under an Emergency Use Authorization (EUA). This EUA will remain in effect (meaning this test can be used) for the duration of the COVID-19 declaration under Section 564(b)(1) of the Act, 21 U.S.C. section 360bbb-3(b)(1), unless the authorization is terminated or revoked.  Performed at [facility], [address] [place], [place] [address]   MRSA Next Gen by PCR, Nasal     Status: None   Collection Time: 06/12/22  9:50 AM   Specimen: Nasal Mucosa; Nasal Swab  Result Value Ref Range Status   MRSA by PCR Next Gen NOT DETECTED NOT DETECTED Final    Comment: (NOTE) The GeneXpert MRSA Assay (FDA approved for NASAL specimens only), is one component of a comprehensive MRSA colonization surveillance program. It is not intended to diagnose MRSA infection nor to guide or monitor treatment for MRSA infections. Test performance is not FDA approved in patients less than 43 years old. Performed at [facility], [address] [place], [place] [address]      Time coordinating discharge: 32 minutes  SIGNED:   Dorcas Carrow, MD  [facility] 06/15/2022, 10:00 AM

## 2022-06-15 NOTE — Plan of Care (Signed)

## 2022-06-16 LAB — CULTURE, BLOOD (ROUTINE X 2)
Culture: NO GROWTH
Culture: NO GROWTH
Special Requests: ADEQUATE

## 2022-08-07 ENCOUNTER — Ambulatory Visit: Payer: Medicaid Other | Admitting: Nurse Practitioner

## 2022-08-07 VITALS — BP 132/68 | HR 50 | Temp 97.0°F | Ht 67.0 in | Wt 178.0 lb

## 2022-08-07 DIAGNOSIS — L989 Disorder of the skin and subcutaneous tissue, unspecified: Secondary | ICD-10-CM

## 2022-08-07 DIAGNOSIS — N760 Acute vaginitis: Secondary | ICD-10-CM

## 2022-08-07 MED ORDER — PREDNISONE 50 MG PO TABS: ORAL_TABLET | ORAL | 0 refills | Status: DC

## 2022-08-07 MED ORDER — NYSTATIN 100000 UNIT/GM EX CREA: 1.0000 | TOPICAL_CREAM | Freq: Two times a day (BID) | CUTANEOUS | 0 refills | Status: DC

## 2022-08-07 MED ORDER — HYDROXYZINE PAMOATE 100 MG PO CAPS: 100.0000 mg | ORAL_CAPSULE | Freq: Every evening | ORAL | 0 refills | Status: DC

## 2022-08-07 NOTE — Patient Instructions (Signed)
1) cool cloths to affected 2) leave area open to air 3) steroid and nystatin cream 4) Follow up appt on Friday

## 2022-08-07 NOTE — Progress Notes (Signed)
   Patient ID: Kim Howard, Sex: female DOB: 05/31/1971, 52 y.o..   MRN: [id number]   No chief complaint on file.    Skin allergic reaction     Review of Systems  Constitutional: Negative.   HENT: Negative.    Eyes: Negative.   Respiratory: Negative.    Cardiovascular: Negative.   Gastrointestinal: Negative.   Genitourinary: Negative.   Musculoskeletal: Negative.   Skin:  Positive for rash.  Neurological: Negative.   Endo/Heme/Allergies: Negative.   Psychiatric/Behavioral: Negative.       Physical Exam Constitutional:      Appearance: Normal appearance.  HENT:     Head: Normocephalic.     Nose: Nose normal.     Mouth/Throat:     Mouth: Mucous membranes are moist.  Eyes:     Pupils: Pupils are equal, round, and reactive to light.  Cardiovascular:     Rate and Rhythm: Normal rate and regular rhythm.  Pulmonary:     Breath sounds: Normal breath sounds.  Abdominal:     General: Bowel sounds are normal.     Palpations: Abdomen is soft.  Musculoskeletal:        General: Normal range of motion.     Cervical back: Normal range of motion.  Skin:    General: Skin is warm and dry.  Neurological:     Mental Status: She is alert and oriented to person, place, and time.  Psychiatric:        Mood and Affect: Mood normal.        Behavior: Behavior normal.       Problem List Items Addressed This Visit       Musculoskeletal and Integument   Adverse skin reaction due to herbal medication   Relevant Medications   predniSONE (DELTASONE) 50 MG tablet   hydrOXYzine (VISTARIL) 100 MG capsule     Genitourinary   Vaginitis - Primary    1) cool cloths to affected 2) leave area open to air 3) steroid and nystatin cream      Relevant Medications   predniSONE (DELTASONE) 50 MG tablet   hydrOXYzine (VISTARIL) 100 MG capsule   nystatin cream (MYCOSTATIN)      Evern Bio, NP

## 2022-08-09 NOTE — Assessment & Plan Note (Signed)
1) cool cloths to affected 2) leave area open to air 3) steroid and nystatin cream

## 2022-08-10 ENCOUNTER — Ambulatory Visit: Payer: Medicaid Other | Admitting: Nurse Practitioner

## 2022-08-23 ENCOUNTER — Ambulatory Visit: Payer: Medicaid Other | Admitting: Nurse Practitioner

## 2022-10-19 ENCOUNTER — Other Ambulatory Visit: Payer: Self-pay | Admitting: Nurse Practitioner

## 2022-10-19 DIAGNOSIS — N760 Acute vaginitis: Secondary | ICD-10-CM

## 2022-10-19 DIAGNOSIS — L989 Disorder of the skin and subcutaneous tissue, unspecified: Secondary | ICD-10-CM

## 2022-11-01 ENCOUNTER — Ambulatory Visit: Payer: Medicaid Other | Admitting: Nurse Practitioner

## 2022-11-01 ENCOUNTER — Encounter: Payer: Self-pay | Admitting: Nurse Practitioner

## 2022-11-01 VITALS — BP 120/80 | HR 75 | Ht 67.0 in | Wt 176.4 lb

## 2022-11-01 DIAGNOSIS — Z202 Contact with and (suspected) exposure to infections with a predominantly sexual mode of transmission: Secondary | ICD-10-CM

## 2022-11-01 DIAGNOSIS — N760 Acute vaginitis: Secondary | ICD-10-CM | POA: Diagnosis not present

## 2022-11-01 DIAGNOSIS — F17200 Nicotine dependence, unspecified, uncomplicated: Secondary | ICD-10-CM

## 2022-11-01 MED ORDER — METRONIDAZOLE 500 MG PO TABS: 500.0000 mg | ORAL_TABLET | Freq: Two times a day (BID) | ORAL | 0 refills | Status: DC

## 2022-11-01 MED ORDER — FLUCONAZOLE 150 MG PO TABS: 150.0000 mg | ORAL_TABLET | Freq: Every day | ORAL | 0 refills | Status: DC

## 2022-11-01 NOTE — Patient Instructions (Signed)
1) Check STI labs 2) Fluconazole and metronidazole 3) Follow up prn

## 2022-11-01 NOTE — Progress Notes (Signed)
Established Patient Office Visit  Subjective:  Patient ID: Kim Howard, female    DOB: [birth year]  Age: 51 y.o. MRN: [id number]  Chief Complaint  Patient presents with   Acute Visit    Possible BV    Acute visit.  Pt thinks she may have BV.      No other concerns at this time.   Past Medical History:  Diagnosis Date   Depression    GERD (gastroesophageal reflux disease)    Helicobacter pylori gastritis    Irritable bowel syndrome (IBS)    Tobacco use     Past Surgical History:  Procedure Laterality Date   ESOPHAGOGASTRODUODENOSCOPY (EGD) WITH PROPOFOL N/A 01/08/2018   Procedure: ESOPHAGOGASTRODUODENOSCOPY (EGD) WITH PROPOFOL;  Surgeon: Scot Jun, MD;  Location: [facility] ENDOSCOPY;  Service: Endoscopy;  Laterality: N/A;   INCISE AND DRAIN ABCESS      Social History   Socioeconomic History   Marital status: Single    Spouse name: Not on file   Number of children: Not on file   Years of education: Not on file   Highest education level: Not on file  Occupational History   Not on file  Tobacco Use   Smoking status: Every Day    Packs/day: 1    Types: Cigarettes   Smokeless tobacco: Never  Vaping Use   Vaping Use: Never used  Substance and Sexual Activity   Alcohol use: No   Drug use: Yes    Types: Marijuana   Sexual activity: Not Currently  Other Topics Concern   Not on file  Social History Narrative   Lives at home, independent at baseline   Social Determinants of Health   Financial Resource Strain: Not on file  Food Insecurity: No Food Insecurity (06/12/2022)   Hunger Vital Sign    Worried About Running Out of Food in the Last Year: Never true    Ran Out of Food in the Last Year: Never true  Transportation Needs: No Transportation Needs (06/12/2022)   PRAPARE - Administrator, Civil Service (Medical): No    Lack of Transportation (Non-Medical): No  Physical Activity: Not on file  Stress: Not on file  Social Connections: Not on  file  Intimate Partner Violence: Not At Risk (06/12/2022)   Humiliation, Afraid, Rape, and Kick questionnaire    Fear of Current or Ex-Partner: No    Emotionally Abused: No    Physically Abused: No    Sexually Abused: No    Family History  Problem Relation Age of Onset   Prostate cancer Father     Allergies  Allergen Reactions   Penicillins Hives and Swelling    Has patient had a PCN reaction causing immediate rash, facial/tongue/throat swelling, SOB or lightheadedness with hypotension: Yes Has patient had a PCN reaction causing severe rash involving mucus membranes or skin necrosis: No Has patient had a PCN reaction that required hospitalization: Unknown Has patient had a PCN reaction occurring within the last 10 years: No If all of the above answers are "NO", then may proceed with Cephalosporin use.    Review of Systems  Constitutional: Negative.   HENT: Negative.    Eyes: Negative.   Respiratory: Negative.    Cardiovascular: Negative.   Gastrointestinal: Negative.   Genitourinary: Negative.   Musculoskeletal: Negative.   Skin: Negative.   Neurological: Negative.   Endo/Heme/Allergies: Negative.   Psychiatric/Behavioral: Negative.         Objective:   BP 120/80  Pulse 75   Ht 5\' 7"  (1.702 m)   Wt 176 lb 6.4 oz (80 kg)   SpO2 96%   BMI 27.63 kg/m   Vitals:   11/01/22 1117  BP: 120/80  Pulse: 75  Height: 5\' 7"  (1.702 m)  Weight: 176 lb 6.4 oz (80 kg)  SpO2: 96%  BMI (Calculated): 27.62    Physical Exam Vitals reviewed.  Constitutional:      Appearance: Normal appearance.  HENT:     Head: Normocephalic.     Nose: Nose normal.     Mouth/Throat:     Mouth: Mucous membranes are moist.  Eyes:     Pupils: Pupils are equal, round, and reactive to light.  Cardiovascular:     Rate and Rhythm: Normal rate and regular rhythm.  Pulmonary:     Effort: Pulmonary effort is normal.     Breath sounds: Normal breath sounds.  Abdominal:     General: Bowel  sounds are normal.     Palpations: Abdomen is soft.  Musculoskeletal:        General: Normal range of motion.     Cervical back: Normal range of motion and neck supple.  Skin:    General: Skin is warm and dry.  Neurological:     Mental Status: She is alert and oriented to person, place, and time.  Psychiatric:        Mood and Affect: Mood normal.        Behavior: Behavior normal.      No results found for any visits on 11/01/22.  No results found for this or any previous visit (from the past 2160 hour(s)).    Assessment & Plan:   Problem List Items Addressed This Visit   None   No follow-ups on file.   Total time spent: 15 minutes  Orson Eva, NP  11/01/2022

## 2022-11-02 LAB — HEPB+HEPC+HIV PANEL
HIV Screen 4th Generation wRfx: NONREACTIVE
Hep B C IgM: NEGATIVE
Hep B Core Total Ab: NEGATIVE
Hep B E Ab: NONREACTIVE
Hep B E Ag: NEGATIVE
Hep B Surface Ab, Qual: NONREACTIVE
Hep C Virus Ab: NONREACTIVE
Hepatitis B Surface Ag: NEGATIVE

## 2022-11-02 LAB — HSV 1 AND 2 AB, IGG
HSV 1 Glycoprotein G Ab, IgG: 2.14 index — ABNORMAL HIGH (ref 0.00–0.90)
HSV 2 IgG, Type Spec: 13.5 index — ABNORMAL HIGH (ref 0.00–0.90)

## 2022-11-02 LAB — RPR: RPR Ser Ql: NONREACTIVE

## 2022-11-09 ENCOUNTER — Emergency Department: Payer: Worker's Compensation

## 2022-11-09 ENCOUNTER — Emergency Department
Admission: EM | Admit: 2022-11-09 | Discharge: 2022-11-09 | Disposition: A | Discharge status: Home / Self Care | Payer: Worker's Compensation | Attending: Student in an Organized Health Care Education/Training Program | Admitting: Student in an Organized Health Care Education/Training Program

## 2022-11-09 ENCOUNTER — Other Ambulatory Visit: Payer: Self-pay

## 2022-11-09 ENCOUNTER — Encounter: Payer: Self-pay | Admitting: Emergency Medicine

## 2022-11-09 DIAGNOSIS — S40021A Contusion of right upper arm, initial encounter: Secondary | ICD-10-CM | POA: Diagnosis not present

## 2022-11-09 DIAGNOSIS — S4991XA Unspecified injury of right shoulder and upper arm, initial encounter: Secondary | ICD-10-CM | POA: Diagnosis present

## 2022-11-09 DIAGNOSIS — M25511 Pain in right shoulder: Secondary | ICD-10-CM | POA: Insufficient documentation

## 2022-11-09 MED ORDER — MORPHINE SULFATE (PF) 4 MG/ML IV SOLN
4.0000 mg | Freq: Once | INTRAVENOUS | Status: AC
  Administered 2022-11-09: 4 mg via INTRAVENOUS
  Filled 2022-11-09: qty 1

## 2022-11-09 MED ORDER — ONDANSETRON HCL 4 MG/2ML IJ SOLN
4.0000 mg | Freq: Once | INTRAMUSCULAR | Status: AC
  Administered 2022-11-09: 4 mg via INTRAVENOUS
  Filled 2022-11-09: qty 2

## 2022-11-09 MED ORDER — OXYCODONE-ACETAMINOPHEN 5-325 MG PO TABS: 1.0000 | ORAL_TABLET | Freq: Four times a day (QID) | ORAL | 0 refills | Status: DC | PRN

## 2022-11-09 MED ORDER — OXYCODONE HCL 5 MG PO TABS
5.0000 mg | ORAL_TABLET | Freq: Once | ORAL | Status: AC
  Administered 2022-11-09: 5 mg via ORAL
  Filled 2022-11-09: qty 1
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
## 2022-11-09 NOTE — Discharge Instructions (Addendum)
Follow-up with your primary care provider if any continued problems.  Ice to your shoulder as needed for discomfort.  Wear the sling for 2 to 3 days for support and protection.  After that began using it slowly to prevent it from getting stiff.  A prescription for oxycodone was sent to the pharmacy to take as needed every 6 hours for pain.  Do not drive or operate machinery while taking this medication.  You may also take ibuprofen with this medication if additional pain medication is needed.

## 2022-11-12 ENCOUNTER — Ambulatory Visit (INDEPENDENT_AMBULATORY_CARE_PROVIDER_SITE_OTHER): Payer: Medicaid Other | Admitting: Nurse Practitioner

## 2022-11-12 ENCOUNTER — Encounter: Payer: Self-pay | Admitting: Nurse Practitioner

## 2022-11-12 VITALS — BP 120/80 | HR 87 | Ht 67.0 in | Wt 172.0 lb

## 2022-11-12 DIAGNOSIS — M25511 Pain in right shoulder: Secondary | ICD-10-CM | POA: Insufficient documentation

## 2022-11-12 NOTE — Patient Instructions (Signed)
1) [facility] Walk in today

## 2022-11-12 NOTE — Progress Notes (Signed)
Established Patient Office Visit  Subjective:  Patient ID: Kim Howard, female    DOB: 1971/05/22  Age: 52 y.o. MRN: [id number]  Chief Complaint  Patient presents with   Acute Visit    Right shoulder pain    ER follow up for right shoulder dislocation in injury.  Unable to move right arm and hand well at all.  Has percocet but reports not effective.    No other concerns at this time.   Past Medical History:  Diagnosis Date   Depression    GERD (gastroesophageal reflux disease)    Helicobacter pylori gastritis    Irritable bowel syndrome (IBS)    Tobacco use     Past Surgical History:  Procedure Laterality Date   ESOPHAGOGASTRODUODENOSCOPY (EGD) WITH PROPOFOL N/A 01/08/2018   Procedure: ESOPHAGOGASTRODUODENOSCOPY (EGD) WITH PROPOFOL;  Surgeon: Scot Jun, MD;  Location: [facility] ENDOSCOPY;  Service: Endoscopy;  Laterality: N/A;   INCISE AND DRAIN ABCESS      Social History   Socioeconomic History   Marital status: Single    Spouse name: Not on file   Number of children: Not on file   Years of education: Not on file   Highest education level: Not on file  Occupational History   Not on file  Tobacco Use   Smoking status: Every Day    Packs/day: 1    Types: Cigarettes   Smokeless tobacco: Never  Vaping Use   Vaping Use: Never used  Substance and Sexual Activity   Alcohol use: No   Drug use: Yes    Types: Marijuana   Sexual activity: Not Currently  Other Topics Concern   Not on file  Social History Narrative   Lives at home, independent at baseline   Social Determinants of Health   Financial Resource Strain: Not on file  Food Insecurity: No Food Insecurity (06/12/2022)   Hunger Vital Sign    Worried About Running Out of Food in the Last Year: Never true    Ran Out of Food in the Last Year: Never true  Transportation Needs: No Transportation Needs (06/12/2022)   PRAPARE - Administrator, Civil Service (Medical): No    Lack of  Transportation (Non-Medical): No  Physical Activity: Not on file  Stress: Not on file  Social Connections: Not on file  Intimate Partner Violence: Not At Risk (06/12/2022)   Humiliation, Afraid, Rape, and Kick questionnaire    Fear of Current or Ex-Partner: No    Emotionally Abused: No    Physically Abused: No    Sexually Abused: No    Family History  Problem Relation Age of Onset   Prostate cancer Father     Allergies  Allergen Reactions   Penicillins Hives and Swelling    Has patient had a PCN reaction causing immediate rash, facial/tongue/throat swelling, SOB or lightheadedness with hypotension: Yes Has patient had a PCN reaction causing severe rash involving mucus membranes or skin necrosis: No Has patient had a PCN reaction that required hospitalization: Unknown Has patient had a PCN reaction occurring within the last 10 years: No If all of the above answers are "NO", then may proceed with Cephalosporin use.    Review of Systems  Constitutional: Negative.   HENT: Negative.    Eyes: Negative.   Respiratory: Negative.    Cardiovascular: Negative.   Gastrointestinal: Negative.   Genitourinary: Negative.   Musculoskeletal:  Positive for joint pain and myalgias.  Skin: Negative.   Neurological: Negative.  Endo/Heme/Allergies: Negative.   Psychiatric/Behavioral: Negative.         Objective:   BP 120/80   Pulse 87   Ht 5\' 7"  (1.702 m)   Wt 172 lb (78 kg)   SpO2 94%   BMI 26.94 kg/m   Vitals:   11/12/22 1426  BP: 120/80  Pulse: 87  Height: 5\' 7"  (1.702 m)  Weight: 172 lb (78 kg)  SpO2: 94%  BMI (Calculated): 26.93    Physical Exam Vitals reviewed.  Constitutional:      Appearance: Normal appearance.  HENT:     Head: Normocephalic.     Nose: Nose normal.     Mouth/Throat:     Mouth: Mucous membranes are moist.  Eyes:     Pupils: Pupils are equal, round, and reactive to light.  Cardiovascular:     Rate and Rhythm: Normal rate and regular rhythm.   Pulmonary:     Effort: Pulmonary effort is normal.     Breath sounds: Normal breath sounds.  Abdominal:     General: Bowel sounds are normal.     Palpations: Abdomen is soft.  Musculoskeletal:        General: Swelling and tenderness present.     Cervical back: Normal range of motion and neck supple.  Skin:    General: Skin is warm and dry.  Neurological:     Mental Status: She is alert and oriented to person, place, and time.  Psychiatric:        Mood and Affect: Mood normal.        Behavior: Behavior normal.      No results found for any visits on 11/12/22.  Recent Results (from the past 2160 hour(s))  HepB+HepC+HIV Panel     Status: None   Collection Time: 11/01/22 11:44 AM  Result Value Ref Range   Hepatitis B Surface Ag Negative Negative   Hep B E Ag Negative Negative   Hep B C IgM Negative Negative   Hep B Core Total Ab Negative Negative   Hep B E Ab Non Reactive Negative   Hep B Surface Ab, Qual Non Reactive     Comment:               Non Reactive: Inconsistent with immunity,                             less than 10 mIU/mL               Reactive:     Consistent with immunity,                             greater than 9.9 mIU/mL    Hep C Virus Ab Non Reactive Non Reactive    Comment: HCV antibody alone does not differentiate between previously resolved infection and active infection. Equivocal and Reactive HCV antibody results should be followed up with an HCV RNA test to support the diagnosis of active HCV infection.    HIV Screen 4th Generation wRfx Non Reactive Non Reactive    Comment: HIV-1/HIV-2 antibodies and HIV-1 p24 antigen were NOT detected. There is no laboratory evidence of HIV infection. HIV Negative   RPR     Status: None   Collection Time: 11/01/22 11:44 AM  Result Value Ref Range   RPR Ser Ql Non Reactive Non Reactive  HSV 1 and 2 Ab, IgG  Status: Abnormal   Collection Time: 11/01/22 11:44 AM  Result Value Ref Range   HSV 1 Glycoprotein  G Ab, IgG 2.14 (H) 0.00 - 0.90 index    Comment:                                  Negative        <0.91                                  Equivocal 0.91 - 1.09                                  Positive        >1.09  Note: Negative indicates no antibodies detected to  HSV-1. Equivocal may suggest early infection.  If  clinically appropriate, retest at later date. 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    HSV 2 IgG, Type Spec 13.50 (H) 0.00 - 0.90 index    Comment:                                  Negative        <0.91                                  Equivocal 0.91 - 1.09                                  Positive        >1.09  HSV-2 Antibody Interpretation: Current guidelines and  recommendations do not recommend routine screening  for HSV-2 in asymptomatic individuals, including  those that are pregnant. A negative antibody result  indicates no detectable antibodies to HSV-2 were  found. If recent exposure is suspected, retest in 4  to 6 weeks. Equivocal samples should be retested in  4 to 6 weeks. A positive result indicates the  presence of detectable IgG antibody to HSV-2.  FALSE POSITIVE RESULTS MAY OCCUR. Repeat testing, or  testing by a different method, may be indicated in  some settings (e.g. patients with low likelihood of  HSV infection). If clinically appropriate, retest 4  to 6 weeks later. HSV-2 IgG antibody testing results  should be clinically correlated.       Assessment & Plan:   Problem List Items Addressed This Visit   None   No follow-ups on file.   Total time spent: 10 minutes  Orson Eva, NP  11/12/2022

## 2022-12-21 DIAGNOSIS — M7502 Adhesive capsulitis of left shoulder: Secondary | ICD-10-CM | POA: Insufficient documentation

## 2022-12-31 ENCOUNTER — Emergency Department: Payer: Worker's Compensation

## 2022-12-31 ENCOUNTER — Other Ambulatory Visit: Payer: Self-pay

## 2022-12-31 ENCOUNTER — Encounter: Payer: Self-pay | Admitting: Emergency Medicine

## 2022-12-31 DIAGNOSIS — M25511 Pain in right shoulder: Secondary | ICD-10-CM | POA: Diagnosis present

## 2022-12-31 DIAGNOSIS — M542 Cervicalgia: Secondary | ICD-10-CM | POA: Insufficient documentation

## 2022-12-31 NOTE — ED Triage Notes (Signed)
Pt arrived via POV with reports of R shoulder pain, pt states has a fractured rotator cuff from a work injury, states she had therapy this morning and the pain got worse throughout the day.  Pt states the pain radiates from the back of her R shoulder up to the R side of the neck, denies any new injury, states she thinks she may have turned wrong when she was on the couch.  Pt states she is taking tramadol, ibuprofen.

## 2023-01-01 ENCOUNTER — Emergency Department
Admission: EM | Admit: 2023-01-01 | Discharge: 2023-01-01 | Disposition: A | Discharge status: Home / Self Care | Payer: Worker's Compensation | Attending: Emergency Medicine | Admitting: Emergency Medicine

## 2023-01-01 DIAGNOSIS — M25511 Pain in right shoulder: Secondary | ICD-10-CM

## 2023-01-01 MED ORDER — MORPHINE SULFATE (PF) 4 MG/ML IV SOLN
4.0000 mg | Freq: Once | INTRAVENOUS | Status: AC
  Administered 2023-01-01: 4 mg via INTRAMUSCULAR
  Filled 2023-01-01: qty 1

## 2023-01-01 MED ORDER — HYDROCODONE-ACETAMINOPHEN 5-325 MG PO TABS: 1.0000 | ORAL_TABLET | Freq: Four times a day (QID) | ORAL | 0 refills | Status: DC | PRN

## 2023-01-01 NOTE — ED Provider Notes (Signed)
[facility] Provider Note    Event Date/Time   First MD Initiated Contact with Patient 01/01/23 0102     (approximate)  History   Chief Complaint: Shoulder Pain and Neck Pain  HPI  Kim Howard is a 52 y.o. female with a past medical history depression, gastric reflux, right rotator cuff injury, presents to the emergency department for right shoulder pain.  According to the patient she has a known right rotator cuff injury follows up with [facility].  Patient went to her physical therapy session today which she states they worked at extensively and it began hurting during the session however she went home and took a nap.  States when she awoke she was in severe pain to the right shoulder.  States she is not sure if she slept on it or moved on it wrong after getting home.  Patient took her home tramadol without relief so she came to the emergency department.  Physical Exam   Triage Vital Signs: ED Triage Vitals [12/31/22 2312]  Enc Vitals Group     BP (!) 148/94     Pulse Rate 93     Resp 18     Temp (!) 97.5 F (36.4 C)     Temp Source Oral     SpO2 100 %     Weight 172 lb (78 kg)     Height 5\' 7"  (1.702 m)     Head Circumference      Peak Flow      Pain Score 10     Pain Loc      Pain Edu?      Excl. in GC?     Most recent vital signs: Vitals:   12/31/22 2312 01/01/23 0055  BP: (!) 148/94 (!) 136/95  Pulse: 93 83  Resp: 18 (!) 21  Temp: (!) 97.5 F (36.4 C) (!) 97.5 F (36.4 C)  SpO2: 100% 100%    General: Awake, no distress.  CV:  Good peripheral perfusion.   Resp:  Normal effort.   Abd:  No distention.   Other:  Right upper extremity is neurovascularly intact with 2+ radial pulse.  Good movement of the hand lower arm but states pain with any attempted movement of the right shoulder.  No deformity noted.   ED Results / Procedures / Treatments   RADIOLOGY  I have reviewed and interpreted the x-ray images.  No fracture  or dislocation seen on my evaluation. 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  Patient's presentation is most consistent with acute presentation with potential threat to life or bodily function.  Patient presents emergency department for increased right shoulder pain after physical therapy and possibly sleeping on the arm.  Patient symptoms seem very muscular in nature.  Suspect muscle or joint injury from overuse of physical therapy.  Patient states she has a follow-up appointment next week with her orthopedist.  We will dose his pain medication in the emergency department and discharged with a very short course of Norco as the patient states the tramadol has not been helping.  Discussed with the patient she should not take Norco and tramadol she should take 1 or the other.  Discussed with the patient not drinking alcohol or driving  while taking either of these medications.  I also discussed with the patient following up with her orthopedist for ongoing pain management and further treatment plan.  Patient agreeable to plan of care.  FINAL CLINICAL IMPRESSION(S) / ED DIAGNOSES   Right shoulder pain  Rx / DC Orders   Norco x 8  Note:  This document was prepared using Dragon voice recognition software and may include unintentional dictation errors.   Minna Antis, MD 01/01/23 0110

## 2023-01-01 NOTE — ED Notes (Signed)
Pt medication hold following morphine administration at this time. Recommended to call for ride home. Pt verbalized understanding.

## 2023-01-01 NOTE — ED Notes (Signed)
Discharge instructions provided by edp were discussed with pt. Pt verbalized understanding with no additional questions at this time.  

## 2023-01-29 ENCOUNTER — Other Ambulatory Visit: Payer: Self-pay | Admitting: Nurse Practitioner

## 2023-01-29 MED ORDER — GABAPENTIN 100 MG PO CAPS: 100.0000 mg | ORAL_CAPSULE | Freq: Two times a day (BID) | ORAL | 2 refills | Status: DC

## 2023-01-29 MED ORDER — CYCLOBENZAPRINE HCL ER 15 MG PO CP24: 15.0000 mg | ORAL_CAPSULE | Freq: Every day | ORAL | 2 refills | Status: AC

## 2023-01-31 DIAGNOSIS — M654 Radial styloid tenosynovitis [de Quervain]: Secondary | ICD-10-CM | POA: Insufficient documentation

## 2023-02-08 ENCOUNTER — Other Ambulatory Visit: Payer: Self-pay | Admitting: Nurse Practitioner

## 2023-02-08 DIAGNOSIS — N760 Acute vaginitis: Secondary | ICD-10-CM

## 2023-02-08 DIAGNOSIS — L989 Disorder of the skin and subcutaneous tissue, unspecified: Secondary | ICD-10-CM

## 2023-04-05 ENCOUNTER — Ambulatory Visit: Payer: Medicaid Other | Admitting: Cardiology

## 2023-04-23 ENCOUNTER — Other Ambulatory Visit: Payer: Self-pay

## 2023-04-23 ENCOUNTER — Emergency Department
Admission: EM | Admit: 2023-04-23 | Discharge: 2023-04-23 | Disposition: A | Discharge status: Home / Self Care | Payer: Medicaid Other | Attending: Emergency Medicine | Admitting: Emergency Medicine

## 2023-04-23 ENCOUNTER — Emergency Department: Payer: Medicaid Other

## 2023-04-23 ENCOUNTER — Encounter: Payer: Self-pay | Admitting: *Deleted

## 2023-04-23 DIAGNOSIS — J441 Chronic obstructive pulmonary disease with (acute) exacerbation: Secondary | ICD-10-CM | POA: Diagnosis not present

## 2023-04-23 DIAGNOSIS — R0602 Shortness of breath: Secondary | ICD-10-CM

## 2023-04-23 LAB — CBC
HCT: 45.6 % (ref 36.0–46.0)
Hemoglobin: 15.3 g/dL — ABNORMAL HIGH (ref 12.0–15.0)
MCH: 31.5 pg (ref 26.0–34.0)
MCHC: 33.6 g/dL (ref 30.0–36.0)
MCV: 93.8 fL (ref 80.0–100.0)
Platelets: 330 10*3/uL (ref 150–400)
RBC: 4.86 MIL/uL (ref 3.87–5.11)
RDW: 12.3 % (ref 11.5–15.5)
WBC: 13 10*3/uL — ABNORMAL HIGH (ref 4.0–10.5)
nRBC: 0 % (ref 0.0–0.2)

## 2023-04-23 LAB — BASIC METABOLIC PANEL
Anion gap: 13 (ref 5–15)
BUN: 7 mg/dL (ref 6–20)
CO2: 22 mmol/L (ref 22–32)
Calcium: 9.7 mg/dL (ref 8.9–10.3)
Chloride: 100 mmol/L (ref 98–111)
Creatinine, Ser: 0.81 mg/dL (ref 0.44–1.00)
GFR, Estimated: >60 mL/min (ref >60)
Glucose, Bld: 93 mg/dL (ref 70–99)
Potassium: 3.9 mmol/L (ref 3.5–5.1)
Sodium: 135 mmol/L (ref 135–145)

## 2023-04-23 LAB — TROPONIN I (HIGH SENSITIVITY): Troponin I (High Sensitivity): 3 ng/L (ref <18)

## 2023-04-23 MED ORDER — IPRATROPIUM-ALBUTEROL 0.5-2.5 (3) MG/3ML IN SOLN
6.0000 mL | Freq: Once | RESPIRATORY_TRACT | Status: AC
  Administered 2023-04-23: 6 mL via RESPIRATORY_TRACT
  Filled 2023-04-23: qty 9

## 2023-04-23 MED ORDER — ALBUTEROL SULFATE (2.5 MG/3ML) 0.083% IN NEBU: 2.5000 mg | INHALATION_SOLUTION | RESPIRATORY_TRACT | 2 refills | Status: DC | PRN

## 2023-04-23 MED ORDER — MAGNESIUM SULFATE 2 GM/50ML IV SOLN
2.0000 g | Freq: Once | INTRAVENOUS | Status: AC
  Administered 2023-04-23: 2 g via INTRAVENOUS
  Filled 2023-04-23: qty 50

## 2023-04-23 MED ORDER — METHYLPREDNISOLONE SODIUM SUCC 125 MG IJ SOLR
125.0000 mg | Freq: Once | INTRAMUSCULAR | Status: AC
  Administered 2023-04-23: 125 mg via INTRAVENOUS
  Filled 2023-04-23: qty 2

## 2023-04-23 MED ORDER — DOXYCYCLINE HYCLATE 100 MG PO TABS
100.0000 mg | ORAL_TABLET | Freq: Once | ORAL | Status: AC
  Administered 2023-04-23: 100 mg via ORAL
  Filled 2023-04-23: qty 1

## 2023-04-23 MED ORDER — FLUTICASONE-SALMETEROL 55-14 MCG/ACT IN AEPB: 1.0000 | INHALATION_SPRAY | Freq: Two times a day (BID) | RESPIRATORY_TRACT | 3 refills | Status: DC

## 2023-04-23 MED ORDER — DOXYCYCLINE HYCLATE 50 MG PO CAPS: 100.0000 mg | ORAL_CAPSULE | Freq: Two times a day (BID) | ORAL | 0 refills | Status: DC

## 2023-04-23 MED ORDER — BENZONATATE 100 MG PO CAPS
200.0000 mg | ORAL_CAPSULE | Freq: Once | ORAL | Status: AC
  Administered 2023-04-23: 200 mg via ORAL
  Filled 2023-04-23: qty 2

## 2023-04-23 MED ORDER — PREDNISONE 10 MG (21) PO TBPK: ORAL_TABLET | ORAL | 0 refills | Status: DC

## 2023-04-23 MED ORDER — GUAIFENESIN ER 600 MG PO TB12
600.0000 mg | ORAL_TABLET | Freq: Two times a day (BID) | ORAL | 2 refills | Status: DC
Start: 2023-04-23 — End: 2023-10-21

## 2023-04-23 MED ORDER — ALBUTEROL SULFATE (2.5 MG/3ML) 0.083% IN NEBU
5.0000 mg | INHALATION_SOLUTION | Freq: Once | RESPIRATORY_TRACT | Status: AC
  Administered 2023-04-23: 5 mg via RESPIRATORY_TRACT
  Filled 2023-04-23: qty 6

## 2023-04-23 NOTE — ED Triage Notes (Addendum)
Pt ambulatory to triage.  Pt with wheezing and sob for 2 days.  Pt has chest tightness.  Pt using inhalers without relief.  Hx copd.  Cig smoker.  Pt alert.  Audible wheezing heard.   pt unable to speak in complete sentences.

## 2023-04-23 NOTE — ED Provider Notes (Signed)
[facility] Provider Note   Event Date/Time   First MD Initiated Contact with Patient 04/23/23 1559     (approximate) History  Shortness of Breath  HPI Kim Howard is a 52 y.o. female with a past medical history of COPD who presents complaining of shortness of breath that began last night and has been worsening since onset.  Patient states that she has attempted to use her albuterol inhaler however has run out of nebulizer medications.  Patient states that she is not on a daily controller medication for her COPD.  Patient denies any fevers but states that this cough is productive of white and green sputum. ROS: Patient currently denies any vision changes, tinnitus, difficulty speaking, facial droop, sore throat, chest pain, abdominal pain, nausea/vomiting/diarrhea, dysuria, or weakness/numbness/paresthesias in any extremity   Physical Exam  Triage Vital Signs: ED Triage Vitals  Encounter Vitals Group     BP 04/23/23 1543 (!) 154/116     Systolic BP Percentile --      Diastolic BP Percentile --      Pulse Rate 04/23/23 1543 (!) 102     Resp 04/23/23 1543 (!) 34     Temp 04/23/23 1543 98.6 F (37 C)     Temp Source 04/23/23 1543 Oral     SpO2 04/23/23 1543 96 %     Weight 04/23/23 1544 171 lb 15.3 oz (78 kg)     Height 04/23/23 1544 5\' 7"  (1.702 m)     Head Circumference --      Peak Flow --      Pain Score 04/23/23 1543 5     Pain Loc --      Pain Education --      Exclude from Growth Chart --    Most recent vital signs: Vitals:   04/23/23 1543  BP: (!) 154/116  Pulse: (!) 102  Resp: (!) 34  Temp: 98.6 F (37 C)  SpO2: 96%   General: Awake, oriented x4. CV:  Good peripheral perfusion.  Resp:  Increased effort.  Expiratory and inspiratory wheezing over bilateral lung fields Abd:  No distention.  Other:  Middle-aged overweight African-American female resting in stretcher in mild respiratory distress ED Results / Procedures / Treatments   Labs (all labs ordered are listed, but only abnormal results are displayed) Labs Reviewed  CBC - Abnormal; Notable for the following components:      Result Value   WBC 13.0 (*)    Hemoglobin 15.3 (*)    All other components within normal limits  BASIC METABOLIC PANEL  TROPONIN I (HIGH SENSITIVITY)  TROPONIN I (HIGH SENSITIVITY)   EKG ED ECG REPORT I, Merwyn Katos, the attending physician, personally viewed and interpreted this ECG. Date: 04/23/2023 EKG Time: 1552 Rate: 99 Rhythm: normal sinus rhythm QRS Axis: normal Intervals: normal ST/T Wave abnormalities: normal Narrative Interpretation: no evidence of acute ischemia RADIOLOGY ED MD interpretation: Single view portable chest x-ray shows increased density at the left lung base possibly related to overlying breast tissue although small volume pleural effusion cannot be excluded -Agree with radiology assessment Official radiology report(s): DG Chest Port 1 View  Result Date: 04/23/2023 CLINICAL DATA:  Shortness of breath EXAM: PORTABLE CHEST 1 VIEW COMPARISON:  06/11/2022 FINDINGS: Numerous leads and wires project over the chest. Midline trachea. Normal heart size for level of inspiration. Increased density at the left lung base could represent overlying breast tissue. Right lung is clear. No pneumothorax. IMPRESSION: Increased density at  the left lung base could represent overlying breast tissue. Small volume pleural fluid and adjacent airspace disease cannot be excluded. If this is a concern, recommend PA and lateral radiographs. Electronically Signed   By: Jeronimo Greaves M.D.   On: 04/23/2023 19:00   PROCEDURES: Critical Care performed: No .1-3 Lead EKG Interpretation  Performed by: Merwyn Katos, MD Authorized by: Merwyn Katos, MD     Interpretation: normal     ECG rate:  92   ECG rate assessment: normal     Rhythm: sinus rhythm     Ectopy: none     Conduction: normal    MEDICATIONS ORDERED IN ED: Medications   doxycycline (VIBRA-TABS) tablet 100 mg (has no administration in time range)  ipratropium-albuterol (DUONEB) 0.5-2.5 (3) MG/3ML nebulizer solution 6 mL (6 mLs Nebulization Given 04/23/23 1602)  methylPREDNISolone sodium succinate (SOLU-MEDROL) 125 mg/2 mL injection 125 mg (125 mg Intravenous Given 04/23/23 1602)  magnesium sulfate IVPB 2 g 50 mL (0 g Intravenous Stopped 04/23/23 1638)  albuterol (PROVENTIL) (2.5 MG/3ML) 0.083% nebulizer solution 5 mg (5 mg Nebulization Given 04/23/23 1729)  benzonatate (TESSALON) capsule 200 mg (200 mg Oral Given 04/23/23 1729)   IMPRESSION / MDM / ASSESSMENT AND PLAN / ED COURSE  I reviewed the triage vital signs and the nursing notes.                             The patient is on the cardiac monitor to evaluate for evidence of arrhythmia and/or significant heart rate changes. Patient's presentation is most consistent with acute presentation with potential threat to life or bodily function. The patient appears to be suffering from a moderate exacerbation of COPD.  Based on the history, exam, CXR/EKG, and further workup I don't suspect any other emergent cause of this presentation, such as pneumonia, acute coronary syndrome, congestive heart failure, pulmonary embolism, or pneumothorax.  ED Interventions: bronchodilators, steroids, antibiotics, reassess  1922 Reassessment: After treatment, the patient's shortness of breath is resolved, and their lung exam has returned to baseline. They are comfortable and want to go home.  Rx: Steroids, Antibiotics, Albuterol Disposition: Discharge home with SRP. PCP follow up recommended in next 48hours.   FINAL CLINICAL IMPRESSION(S) / ED DIAGNOSES   Final diagnoses:  COPD exacerbation (HCC)  SOB (shortness of breath)   Rx / DC Orders   ED Discharge Orders          Ordered    albuterol (PROVENTIL) (2.5 MG/3ML) 0.083% nebulizer solution  Every 4 hours PRN        04/23/23 1920    Fluticasone-Salmeterol (AIRDUO  RESPICLICK 55/14) 55-14 MCG/ACT AEPB  2 times daily        04/23/23 1920    predniSONE (STERAPRED UNI-PAK 21 TAB) 10 MG (21) TBPK tablet        04/23/23 1920    doxycycline (VIBRAMYCIN) 50 MG capsule  2 times daily        04/23/23 1920    guaiFENesin (MUCINEX) 600 MG 12 hr tablet  2 times daily        04/23/23 1921           Note:  This document was prepared using Dragon voice recognition software and may include unintentional dictation errors.   Merwyn Katos, MD 04/23/23 Ernestina [place]

## 2023-04-25 ENCOUNTER — Inpatient Hospital Stay
Admission: EM | Admit: 2023-04-25 | Discharge: 2023-04-28 | DRG: 189 | Disposition: A | Discharge status: Home / Self Care | Payer: Medicaid Other | Attending: Internal Medicine | Admitting: Internal Medicine

## 2023-04-25 ENCOUNTER — Other Ambulatory Visit: Payer: Self-pay

## 2023-04-25 ENCOUNTER — Emergency Department: Payer: Medicaid Other

## 2023-04-25 DIAGNOSIS — J441 Chronic obstructive pulmonary disease with (acute) exacerbation: Principal | ICD-10-CM | POA: Diagnosis present

## 2023-04-25 DIAGNOSIS — D72829 Elevated white blood cell count, unspecified: Secondary | ICD-10-CM | POA: Diagnosis not present

## 2023-04-25 DIAGNOSIS — J9601 Acute respiratory failure with hypoxia: Principal | ICD-10-CM | POA: Diagnosis present

## 2023-04-25 DIAGNOSIS — J189 Pneumonia, unspecified organism: Secondary | ICD-10-CM

## 2023-04-25 DIAGNOSIS — F17200 Nicotine dependence, unspecified, uncomplicated: Secondary | ICD-10-CM | POA: Diagnosis present

## 2023-04-25 DIAGNOSIS — Z716 Tobacco abuse counseling: Secondary | ICD-10-CM

## 2023-04-25 DIAGNOSIS — D72823 Leukemoid reaction: Secondary | ICD-10-CM | POA: Diagnosis present

## 2023-04-25 DIAGNOSIS — R000 Tachycardia, unspecified: Secondary | ICD-10-CM | POA: Diagnosis present

## 2023-04-25 DIAGNOSIS — K589 Irritable bowel syndrome without diarrhea: Secondary | ICD-10-CM | POA: Diagnosis present

## 2023-04-25 DIAGNOSIS — Z880 Allergy status to penicillin: Secondary | ICD-10-CM | POA: Diagnosis not present

## 2023-04-25 DIAGNOSIS — Z1152 Encounter for screening for COVID-19: Secondary | ICD-10-CM | POA: Diagnosis not present

## 2023-04-25 DIAGNOSIS — F17210 Nicotine dependence, cigarettes, uncomplicated: Secondary | ICD-10-CM | POA: Diagnosis present

## 2023-04-25 DIAGNOSIS — T380X5A Adverse effect of glucocorticoids and synthetic analogues, initial encounter: Secondary | ICD-10-CM | POA: Diagnosis present

## 2023-04-25 DIAGNOSIS — F329 Major depressive disorder, single episode, unspecified: Secondary | ICD-10-CM | POA: Diagnosis present

## 2023-04-25 DIAGNOSIS — R0603 Acute respiratory distress: Secondary | ICD-10-CM

## 2023-04-25 DIAGNOSIS — R6510 Systemic inflammatory response syndrome (SIRS) of non-infectious origin without acute organ dysfunction: Secondary | ICD-10-CM | POA: Diagnosis not present

## 2023-04-25 DIAGNOSIS — R9431 Abnormal electrocardiogram [ECG] [EKG]: Secondary | ICD-10-CM | POA: Diagnosis present

## 2023-04-25 DIAGNOSIS — R0602 Shortness of breath: Secondary | ICD-10-CM | POA: Diagnosis present

## 2023-04-25 DIAGNOSIS — J810 Acute pulmonary edema: Secondary | ICD-10-CM | POA: Diagnosis present

## 2023-04-25 DIAGNOSIS — Z79899 Other long term (current) drug therapy: Secondary | ICD-10-CM

## 2023-04-25 DIAGNOSIS — K219 Gastro-esophageal reflux disease without esophagitis: Secondary | ICD-10-CM | POA: Diagnosis present

## 2023-04-25 LAB — LACTIC ACID, PLASMA
Lactic Acid, Venous: 1.7 mmol/L (ref 0.5–1.9)
Lactic Acid, Venous: 1.6 mmol/L (ref 0.5–1.9)

## 2023-04-25 LAB — RESP PANEL BY RT-PCR (RSV, FLU A&B, COVID)  RVPGX2
Influenza A by PCR: NEGATIVE
Influenza B by PCR: NEGATIVE
Resp Syncytial Virus by PCR: NEGATIVE
SARS Coronavirus 2 by RT PCR: NEGATIVE

## 2023-04-25 LAB — COMPREHENSIVE METABOLIC PANEL
ALT: 18 U/L (ref 0–44)
AST: 23 U/L (ref 15–41)
Albumin: 4.2 g/dL (ref 3.5–5.0)
Alkaline Phosphatase: 76 U/L (ref 38–126)
Anion gap: 12 (ref 5–15)
BUN: 18 mg/dL (ref 6–20)
CO2: 23 mmol/L (ref 22–32)
Calcium: 9.1 mg/dL (ref 8.9–10.3)
Chloride: 103 mmol/L (ref 98–111)
Creatinine, Ser: 0.85 mg/dL (ref 0.44–1.00)
GFR, Estimated: >60 mL/min (ref >60)
Glucose, Bld: 108 mg/dL — ABNORMAL HIGH (ref 70–99)
Potassium: 3.6 mmol/L (ref 3.5–5.1)
Sodium: 138 mmol/L (ref 135–145)
Total Bilirubin: 0.6 mg/dL (ref 0.3–1.2)
Total Protein: 7.7 g/dL (ref 6.5–8.1)

## 2023-04-25 LAB — CBC WITH DIFFERENTIAL/PLATELET
Abs Immature Granulocytes: 0.08 10*3/uL — ABNORMAL HIGH (ref 0.00–0.07)
Basophils Absolute: 0.1 10*3/uL (ref 0.0–0.1)
Basophils Relative: 0 %
Eosinophils Absolute: 0 10*3/uL (ref 0.0–0.5)
Eosinophils Relative: 0 %
HCT: 42.3 % (ref 36.0–46.0)
Hemoglobin: 14.2 g/dL (ref 12.0–15.0)
Immature Granulocytes: 0 %
Lymphocytes Relative: 34 %
Lymphs Abs: 6.9 10*3/uL — ABNORMAL HIGH (ref 0.7–4.0)
MCH: 31.5 pg (ref 26.0–34.0)
MCHC: 33.6 g/dL (ref 30.0–36.0)
MCV: 93.8 fL (ref 80.0–100.0)
Monocytes Absolute: 1 10*3/uL (ref 0.1–1.0)
Monocytes Relative: 5 %
Neutro Abs: 12.5 10*3/uL — ABNORMAL HIGH (ref 1.7–7.7)
Neutrophils Relative %: 61 %
Platelets: 319 10*3/uL (ref 150–400)
RBC: 4.51 MIL/uL (ref 3.87–5.11)
RDW: 12.6 % (ref 11.5–15.5)
Smear Review: NORMAL
WBC: 20.5 10*3/uL — ABNORMAL HIGH (ref 4.0–10.5)
nRBC: 0 % (ref 0.0–0.2)

## 2023-04-25 LAB — BRAIN NATRIURETIC PEPTIDE: B Natriuretic Peptide: 30.4 pg/mL (ref 0.0–100.0)

## 2023-04-25 LAB — TROPONIN I (HIGH SENSITIVITY)
Troponin I (High Sensitivity): 14 ng/L (ref <18)
Troponin I (High Sensitivity): 10 ng/L (ref <18)

## 2023-04-25 LAB — MAGNESIUM: Magnesium: 2.1 mg/dL (ref 1.7–2.4)

## 2023-04-25 MED ORDER — SODIUM CHLORIDE 0.9 % IV SOLN: 100.0000 mg | Freq: Two times a day (BID) | INTRAVENOUS | Status: DC

## 2023-04-25 MED ORDER — MAGNESIUM SULFATE 2 GM/50ML IV SOLN
2.0000 g | Freq: Once | INTRAVENOUS | Status: AC
  Administered 2023-04-25: 2 g via INTRAVENOUS
  Filled 2023-04-25: qty 50

## 2023-04-25 MED ORDER — LEVOFLOXACIN IN D5W 750 MG/150ML IV SOLN
750.0000 mg | INTRAVENOUS | Status: DC
Start: 2023-04-26 — End: 2023-04-28
  Administered 2023-04-26 – 2023-04-28 (×3): 750 mg via INTRAVENOUS
  Filled 2023-04-25 (×3): qty 150

## 2023-04-25 MED ORDER — ALBUTEROL SULFATE (2.5 MG/3ML) 0.083% IN NEBU
2.5000 mg | INHALATION_SOLUTION | RESPIRATORY_TRACT | Status: DC | PRN
  Administered 2023-04-27: 2.5 mg via RESPIRATORY_TRACT
  Filled 2023-04-25: qty 3

## 2023-04-25 MED ORDER — SODIUM CHLORIDE 0.9 % IV SOLN: INTRAVENOUS | Status: DC

## 2023-04-25 MED ORDER — ONDANSETRON HCL 4 MG/2ML IJ SOLN: 4.0000 mg | Freq: Four times a day (QID) | INTRAMUSCULAR | Status: DC | PRN

## 2023-04-25 MED ORDER — PREDNISONE 20 MG PO TABS
40.0000 mg | ORAL_TABLET | Freq: Every day | ORAL | Status: DC
  Administered 2023-04-26 – 2023-04-28 (×3): 40 mg via ORAL
  Filled 2023-04-25 (×3): qty 2

## 2023-04-25 MED ORDER — ONDANSETRON HCL 4 MG PO TABS: 4.0000 mg | ORAL_TABLET | Freq: Four times a day (QID) | ORAL | Status: DC | PRN

## 2023-04-25 MED ORDER — LEVOFLOXACIN IN D5W 750 MG/150ML IV SOLN
750.0000 mg | Freq: Once | INTRAVENOUS | Status: AC
  Administered 2023-04-25: 750 mg via INTRAVENOUS
  Filled 2023-04-25: qty 150

## 2023-04-25 MED ORDER — IPRATROPIUM-ALBUTEROL 0.5-2.5 (3) MG/3ML IN SOLN
3.0000 mL | Freq: Four times a day (QID) | RESPIRATORY_TRACT | Status: DC
  Administered 2023-04-25 – 2023-04-26 (×7): 3 mL via RESPIRATORY_TRACT
  Filled 2023-04-25 (×7): qty 3

## 2023-04-25 MED ORDER — HYDROCOD POLI-CHLORPHE POLI ER 10-8 MG/5ML PO SUER
5.0000 mL | Freq: Once | ORAL | Status: AC
  Administered 2023-04-25: 5 mL via ORAL
  Filled 2023-04-25: qty 5

## 2023-04-25 MED ORDER — GUAIFENESIN-DM 100-10 MG/5ML PO SYRP
5.0000 mL | ORAL_SOLUTION | ORAL | Status: DC | PRN
  Administered 2023-04-25 – 2023-04-26 (×3): 5 mL via ORAL
  Filled 2023-04-25 (×3): qty 10

## 2023-04-25 MED ORDER — BENZONATATE 100 MG PO CAPS
200.0000 mg | ORAL_CAPSULE | Freq: Three times a day (TID) | ORAL | Status: DC | PRN
  Administered 2023-04-25 – 2023-04-26 (×3): 200 mg via ORAL
  Filled 2023-04-25 (×3): qty 2

## 2023-04-25 MED ORDER — NICOTINE 14 MG/24HR TD PT24
14.0000 mg | MEDICATED_PATCH | Freq: Every day | TRANSDERMAL | Status: DC
  Administered 2023-04-25 – 2023-04-28 (×4): 14 mg via TRANSDERMAL
  Filled 2023-04-25 (×4): qty 1

## 2023-04-25 MED ORDER — ENOXAPARIN SODIUM 40 MG/0.4ML IJ SOSY
40.0000 mg | PREFILLED_SYRINGE | INTRAMUSCULAR | Status: DC
  Administered 2023-04-25 – 2023-04-27 (×3): 40 mg via SUBCUTANEOUS
  Filled 2023-04-25 (×3): qty 0.4

## 2023-04-25 MED ORDER — METHYLPREDNISOLONE SODIUM SUCC 40 MG IJ SOLR
40.0000 mg | Freq: Two times a day (BID) | INTRAMUSCULAR | Status: AC
  Administered 2023-04-25 – 2023-04-26 (×2): 40 mg via INTRAVENOUS
  Filled 2023-04-25 (×2): qty 1

## 2023-04-25 NOTE — Assessment & Plan Note (Signed)
Meeting SIRS criteria with transient heart rate into the 100s as well as respirations into the upper 20s in the setting of acute respiratory failure with hypoxia requiring BiPAP White count 20 with noted recent glucocorticoid use No overt source of infection noted Chest x-ray within normal limits COVID flu and RSV negative Urinalysis pending Lactate within normal limits Will otherwise continue to monitor for now Reassess as appropriate

## 2023-04-25 NOTE — Assessment & Plan Note (Addendum)
Decompensated respiratory status now on BiPAP with wheezing, cough, sputum production Failed recent course of p.o. steroids and doxycycline IV Solu-Medrol Levaquin DuoNebs Continue NIPPV and supplemental oxygen as needed Discussed smoking cessation at length

## 2023-04-25 NOTE — ED Provider Notes (Signed)
[facility] Provider Note    Event Date/Time   First MD Initiated Contact with Patient 04/25/23 0510     (approximate)   History   Shortness of Breath   HPI  Level V caveat: Limited by respiratory distress  Kim Howard is a 52 y.o. female brought to the ED via EMS from home with a chief complaint of respiratory distress.  Patient with a history of COPD who was seen in the ED 2 days ago for exacerbation.  She was placed on doxycycline, guaifenesin and prednisone.  Endorses shortness of breath which worsened this morning.  Denies fever/chills, chest pain, abdominal pain, nausea, vomiting or dizziness.  EMS administered 2 DuoNeb treatments, 125 mg IV Solu-Medrol.     Past Medical History   Past Medical History:  Diagnosis Date   Depression    GERD (gastroesophageal reflux disease)    Helicobacter pylori gastritis    Irritable bowel syndrome (IBS)    Tobacco use      Active Problem List   Patient Active Problem List   Diagnosis Date Noted   Radial styloid tenosynovitis 01/31/2023   Adhesive capsulitis of left shoulder 12/21/2022   Acute pain of right shoulder 11/12/2022   Pain in joint of right shoulder 11/12/2022   Vaginitis 08/07/2022   Adverse skin reaction due to herbal medication 08/07/2022   Acute hypoxemic respiratory failure (HCC) 06/11/2022   Tobacco dependence 06/11/2022   Influenza A 06/11/2022   Musculoskeletal chest pain 06/11/2022   Coughing 06/11/2022   Shortness of breath 06/11/2022   Diarrhea 06/11/2022   Pneumonitis 05/31/2017   H. pylori infection 05/14/2017     Past Surgical History   Past Surgical History:  Procedure Laterality Date   ESOPHAGOGASTRODUODENOSCOPY (EGD) WITH PROPOFOL N/A 01/08/2018   Procedure: ESOPHAGOGASTRODUODENOSCOPY (EGD) WITH PROPOFOL;  Surgeon: Scot Jun, MD;  Location: [facility] ENDOSCOPY;  Service: Endoscopy;  Laterality: N/A;   INCISE AND DRAIN ABCESS       Home Medications    Prior to Admission medications   Medication Sig Start Date End Date Taking? Authorizing Provider  albuterol (PROVENTIL) (2.5 MG/3ML) 0.083% nebulizer solution Take 3 mLs (2.5 mg total) by nebulization every 6 (six) hours as needed for wheezing or shortness of breath. 06/15/22   Dorcas Carrow, MD  albuterol (PROVENTIL) (2.5 MG/3ML) 0.083% nebulizer solution Take 3 mLs (2.5 mg total) by nebulization every 4 (four) hours as needed for wheezing or shortness of breath. 04/23/23 04/22/24  Merwyn Katos, MD  albuterol (VENTOLIN HFA) 108 (90 Base) MCG/ACT inhaler Inhale 2 puffs into the lungs every 6 (six) hours as needed for wheezing or shortness of breath. 06/15/22   Dorcas Carrow, MD  cyclobenzaprine (AMRIX) 15 MG 24 hr capsule Take 1 capsule (15 mg total) by mouth daily. 01/29/23   Miki Kins, FNP  dicyclomine (BENTYL) 10 MG capsule Take 10 mg by mouth 4 (four) times daily.    [provider]  doxycycline (VIBRAMYCIN) 50 MG capsule Take 2 capsules (100 mg total) by mouth 2 (two) times daily for 5 days. 04/23/23 04/28/23  Merwyn Katos, MD  famotidine (PEPCID) 40 MG tablet Take by mouth. 02/28/17   [provider]  Fluticasone-Salmeterol (AIRDUO RESPICLICK 55/14) 55-14 MCG/ACT AEPB Inhale 1 Inhalation into the lungs 2 (two) times daily. 04/23/23   Merwyn Katos, MD  gabapentin (NEURONTIN) 100 MG capsule Take 1 capsule (100 mg total) by mouth 2 (two) times daily. 01/29/23   Miki Kins,  FNP  guaiFENesin (MUCINEX) 600 MG 12 hr tablet Take 1 tablet (600 mg total) by mouth 2 (two) times daily. 04/23/23 04/22/24  Merwyn Katos, MD  HYDROcodone-acetaminophen (NORCO/VICODIN) 5-325 MG tablet Take 1 tablet by mouth every 6 (six) hours as needed for moderate pain. 01/01/23   Minna Antis, MD  hydrOXYzine (VISTARIL) 100 MG capsule TAKE 1 CAPSULE BY MOUTH EVERYDAY AT BEDTIME 02/12/23   Scoggins, Amber, NP  lurasidone (LATUDA) 40 MG TABS tablet Take 40 mg by mouth  daily. Patient not taking: Reported on 06/11/2022    [provider]  metroNIDAZOLE (FLAGYL) 500 MG tablet Take 1 tablet (500 mg total) by mouth 2 (two) times daily. Patient not taking: Reported on 11/12/2022 11/01/22   Orson Eva, NP  mirtazapine (REMERON) 15 MG tablet Take 15 mg by mouth at bedtime. 05/02/20   [provider]  nystatin cream (MYCOSTATIN) APPLY TO AFFECTED AREA TWICE A DAY 02/12/23   Scoggins, Hospital doctor, NP  omeprazole (PRILOSEC) 40 MG capsule TAKE 1 CAPSULE BY MOUTH DAILY IN AM FOR ACID REFLUX Patient not taking: Reported on 11/01/2022 10/22/22   Orson Eva, NP  oxybutynin (DITROPAN) 5 MG tablet Take 5 mg by mouth every morning. Patient not taking: Reported on 11/01/2022 05/22/22   [provider]  oxyCODONE-acetaminophen (PERCOCET) 5-325 MG tablet Take 1 tablet by mouth every 6 (six) hours as needed for severe pain. 11/09/22 11/09/23  Tommi Rumps, PA-C  predniSONE (STERAPRED UNI-PAK 21 TAB) 10 MG (21) TBPK tablet As directed on packaging 04/23/23   Merwyn Katos, MD  rosuvastatin (CRESTOR) 10 MG tablet Take 10 mg by mouth at bedtime. 01/24/22   [provider]  Varenicline Tartrate, Starter, 0.5 MG X 11 & 1 MG X 42 TBPK TAKE AS DIRECTED ON PACKAGE INSTRUCTIONS THEN CALL OFFICE WHEN 2ND MONTH CONTINUING PACK IS NEEDED Patient not taking: Reported on 11/01/2022 10/22/22   Orson Eva, NP     Allergies  Penicillins   Family History   Family History  Problem Relation Age of Onset   Prostate cancer Father      Physical Exam  Triage Vital Signs: ED Triage Vitals  Encounter Vitals Group     BP      Systolic BP Percentile      Diastolic BP Percentile      Pulse      Resp      Temp      Temp src      SpO2      Weight      Height      Head Circumference      Peak Flow      Pain Score      Pain Loc      Pain Education      Exclude from Growth Chart     Updated Vital Signs: BP 128/73   Pulse 81   Temp 97.8 F (36.6  C) (Oral)   Resp (!) 26   Ht 5\' 7"  (1.702 m)   Wt 84.4 kg   LMP 11/22/2017 (Exact Date)   SpO2 98%   BMI 29.13 kg/m    General: Awake, moderate distress.  CV:  RRR.  Good peripheral perfusion.  Resp:  Increased effort.  Diminished aeration.  Tripoding. Abd:  Nontender.  Mild distention.  Other:  Bilateral calves are supple and nontender.   ED Results / Procedures / Treatments  Labs (all labs ordered are listed, but only abnormal results are displayed) Labs Reviewed  CBC WITH DIFFERENTIAL/PLATELET - Abnormal; Notable for the following components:      Result Value   WBC 20.5 (*)    Neutro Abs 12.5 (*)    Lymphs Abs 6.9 (*)    Abs Immature Granulocytes 0.08 (*)    All other components within normal limits  COMPREHENSIVE METABOLIC PANEL - Abnormal; Notable for the following components:   Glucose, Bld 108 (*)    All other components within normal limits  RESP PANEL BY RT-PCR (RSV, FLU A&B, COVID)  [id number]  CULTURE, BLOOD (ROUTINE X 2)  CULTURE, BLOOD (ROUTINE X 2)  MAGNESIUM  LACTIC ACID, PLASMA  BRAIN NATRIURETIC PEPTIDE  LACTIC ACID, PLASMA  TROPONIN I (HIGH SENSITIVITY)  TROPONIN I (HIGH SENSITIVITY)     EKG  ED ECG REPORT I, Willie Loy J, the attending physician, personally viewed and interpreted this ECG.   Date: 04/25/2023  EKG Time: 0511  Rate: 97  Rhythm: normal sinus rhythm  Axis: Normal  Intervals:none  ST&T Change: Nonspecific    RADIOLOGY I have independently visualized and interpreted patient's imaging study as well as noted the radiology interpretation:  Chest x-ray wet read: Increased bilateral opacities  Official radiology report(s): DG Chest Port 1 View  Result Date: 04/25/2023 CLINICAL DATA:  Shortness of breath. Labored breathing and wheezing. Recent COPD exacerbation. EXAM: PORTABLE CHEST 1 VIEW COMPARISON:  None Available. FINDINGS: The heart size and mediastinal contours are within normal limits. Decreased lung volumes. Both lungs  are clear. The visualized skeletal structures are unremarkable. IMPRESSION: No active disease. Electronically Signed   By: Signa Kell M.D.   On: 04/25/2023 05:59     PROCEDURES:  Critical Care performed: Yes, see critical care procedure note(s)  CRITICAL CARE Performed by: Irean Hong   Total critical care time: 45 minutes  Critical care time was exclusive of separately billable procedures and treating other patients.  Critical care was necessary to treat or prevent imminent or life-threatening deterioration.  Critical care was time spent personally by me on the following activities: development of treatment plan with patient and/or surrogate as well as nursing, discussions with consultants, evaluation of patient's response to treatment, examination of patient, obtaining history from patient or surrogate, ordering and performing treatments and interventions, ordering and review of laboratory studies, ordering and review of radiographic studies, pulse oximetry and re-evaluation of patient's condition.   Marland Kitchen1-3 Lead EKG Interpretation  Performed by: Irean Hong, MD Authorized by: Irean Hong, MD     Interpretation: normal     ECG rate:  99   ECG rate assessment: normal     Rhythm: sinus rhythm     Ectopy: none     Conduction: normal   Comments:     Patient placed on cardiac monitor to evaluate for arrhythmias    MEDICATIONS ORDERED IN ED: Medications  levofloxacin (LEVAQUIN) IVPB 750 mg (750 mg Intravenous New Bag/Given 04/25/23 0618)  magnesium sulfate IVPB 2 g 50 mL (2 g Intravenous New Bag/Given 04/25/23 0520)  chlorpheniramine-HYDROcodone (TUSSIONEX) 10-8 MG/5ML suspension 5 mL (5 mLs Oral Given 04/25/23 9381)     IMPRESSION / MDM / ASSESSMENT AND PLAN / ED COURSE  I reviewed the triage vital signs and the nursing notes.                             52 year old female who returns to the ED with respiratory distress. Differential includes, but is not limited to,  viral syndrome,  bronchitis including COPD exacerbation, pneumonia, reactive airway disease including asthma, CHF including exacerbation with or without pulmonary/interstitial edema, pneumothorax, ACS, thoracic trauma, and pulmonary embolism.  I personally viewed patient's records and note her ED visit from 04/23/2023.  Patient's presentation is most consistent with acute presentation with potential threat to life or bodily function.  The patient is on the cardiac monitor to evaluate for evidence of arrhythmia and/or significant heart rate changes.  Patient placed on BiPAP immediately upon her arrival to the treatment room.  Will add 2 g IV Magnesium.  Obtain cardiac panel, chest x-ray.  Anticipate hospitalization.  Clinical Course as of 04/25/23 0654  Thu Apr 25, 2023  0607 Chest x-ray looks like worsening infiltrates compared to prior.  Will obtain blood cultures, lactic acid.  Patient is penicillin allergic, will start IV Levaquin.  Will consult hospital services for evaluation and admission. [JS]    Clinical Course User Index [JS] Irean Hong, MD     FINAL CLINICAL IMPRESSION(S) / ED DIAGNOSES   Final diagnoses:  COPD exacerbation (HCC)  Respiratory distress  Community acquired pneumonia, unspecified laterality     Rx / DC Orders   ED Discharge Orders     None        Note:  This document was prepared using Dragon voice recognition software and may include unintentional dictation errors.   Irean Hong, MD 04/25/23 [phone]

## 2023-04-25 NOTE — ED Notes (Signed)
Patient given lunch tray. Per MD Alvester Morin, patient okay to come off of Bi-pap to eat with application of nasal cannula.

## 2023-04-25 NOTE — Assessment & Plan Note (Addendum)
Still smoking 1/2 to 1 pack/day even through active COPD exacerbation Discussed cessation at length Nicotine patch Monitor

## 2023-04-25 NOTE — Assessment & Plan Note (Signed)
Decompensated respiratory failure now requiring BiPAP in the setting of active COPD exacerbation Chest x-ray grossly within normal limits Continue COPD exacerbation treatment protocol Discussed smoking cessation at length Monitor respiratory status

## 2023-04-25 NOTE — ED Notes (Signed)
Patient taken off of Bi-Pap to eat. Patient placed on 4 L [place]. Patient's O2 sats remain at 95% on 4L [place].

## 2023-04-25 NOTE — H&P (Signed)
History and Physical    Patient: Kim Howard:[id number] DOB: [birth year] DOA: 04/25/2023 DOS: the patient was seen and examined on 04/25/2023 PCP: Orson Eva, NP  Patient coming from: Home  Chief Complaint:  Chief Complaint  Patient presents with   Shortness of Breath   HPI: Kim Howard is a 52 y.o. female with medical history significant of COPD, GERD, presenting with acute respiratory failure with hypoxia, COPD exacerbation.  Patient reports several days of cough, wheezing, increased sputum production.  Was noted to have been seen in the ER 2 days ago for similar symptoms.  Was placed on course of prednisone, doxycycline and inhalers.  Patient with persistence of symptoms.  Still smoking 1/2 pack plus per day despite active COPD exacerbation.  No fevers or chills.  No chest pain.  No nausea or vomiting.  Worsening respiratory status despite treatment plan.  No orthopnea or PND.  No reported alcohol use. Presented to the ER afebrile, heart rate in 100s, BP stable.  Transition to BiPAP of 40% to improve respiratory distress and increased work of breathing.  White count 20.5, hemoglobin 14, platelets 319, lactate 1.6, troponin 7, creatinine 0.85.  Chest x-ray within normal limits. Review of Systems: As mentioned in the history of present illness. All other systems reviewed and are negative. Past Medical History:  Diagnosis Date   Depression    GERD (gastroesophageal reflux disease)    Helicobacter pylori gastritis    Irritable bowel syndrome (IBS)    Tobacco use    Past Surgical History:  Procedure Laterality Date   ESOPHAGOGASTRODUODENOSCOPY (EGD) WITH PROPOFOL N/A 01/08/2018   Procedure: ESOPHAGOGASTRODUODENOSCOPY (EGD) WITH PROPOFOL;  Surgeon: Scot Jun, MD;  Location: [facility] ENDOSCOPY;  Service: Endoscopy;  Laterality: N/A;   INCISE AND DRAIN ABCESS     Social History:  reports that she has been smoking cigarettes. She has never used smokeless tobacco. She  reports current drug use. Drug: Marijuana. She reports that she does not drink alcohol.  Allergies  Allergen Reactions   Penicillins Hives and Swelling    Has patient had a PCN reaction causing immediate rash, facial/tongue/throat swelling, SOB or lightheadedness with hypotension: Yes Has patient had a PCN reaction causing severe rash involving mucus membranes or skin necrosis: No Has patient had a PCN reaction that required hospitalization: Unknown Has patient had a PCN reaction occurring within the last 10 years: No If all of the above answers are "NO", then may proceed with Cephalosporin use.    Family History  Problem Relation Age of Onset   Prostate cancer Father     Prior to Admission medications   Medication Sig Start Date End Date Taking? Authorizing Provider  albuterol (PROVENTIL) (2.5 MG/3ML) 0.083% nebulizer solution Take 3 mLs (2.5 mg total) by nebulization every 4 (four) hours as needed for wheezing or shortness of breath. 04/23/23 04/22/24 Yes Bradler, Clent Jacks, MD  albuterol (VENTOLIN HFA) 108 (90 Base) MCG/ACT inhaler Inhale 2 puffs into the lungs every 6 (six) hours as needed for wheezing or shortness of breath. 06/15/22  Yes Dorcas Carrow, MD  guaiFENesin (MUCINEX) 600 MG 12 hr tablet Take 1 tablet (600 mg total) by mouth 2 (two) times daily. 04/23/23 04/22/24 Yes Bradler, Clent Jacks, MD  omeprazole (PRILOSEC) 40 MG capsule TAKE 1 CAPSULE BY MOUTH DAILY IN AM FOR ACID REFLUX 10/22/22  Yes Boswell, Gareth Morgan, NP  oxybutynin (DITROPAN) 5 MG tablet Take 5 mg by mouth every morning. 05/22/22  Yes [provider]  rosuvastatin (  CRESTOR) 10 MG tablet Take 10 mg by mouth at bedtime. 01/24/22  Yes [provider]  cyclobenzaprine (AMRIX) 15 MG 24 hr capsule Take 1 capsule (15 mg total) by mouth daily. 01/29/23   Miki Kins, FNP  dicyclomine (BENTYL) 10 MG capsule Take 10 mg by mouth 4 (four) times daily.    [provider]  doxycycline (VIBRAMYCIN) 50 MG  capsule Take 2 capsules (100 mg total) by mouth 2 (two) times daily for 5 days. Patient not taking: Reported on 04/25/2023 04/23/23 04/28/23  Merwyn Katos, MD  famotidine (PEPCID) 40 MG tablet Take by mouth. 02/28/17   [provider]  Fluticasone-Salmeterol (AIRDUO RESPICLICK 55/14) 55-14 MCG/ACT AEPB Inhale 1 Inhalation into the lungs 2 (two) times daily. Patient not taking: Reported on 04/25/2023 04/23/23   Merwyn Katos, MD  gabapentin (NEURONTIN) 100 MG capsule Take 1 capsule (100 mg total) by mouth 2 (two) times daily. 01/29/23   Miki Kins, FNP  HYDROcodone-acetaminophen (NORCO/VICODIN) 5-325 MG tablet Take 1 tablet by mouth every 6 (six) hours as needed for moderate pain. Patient not taking: Reported on 04/25/2023 01/01/23   Minna Antis, MD  hydrOXYzine (VISTARIL) 100 MG capsule TAKE 1 CAPSULE BY MOUTH EVERYDAY AT BEDTIME Patient not taking: Reported on 04/25/2023 02/12/23   Scoggins, Amber, NP  lurasidone (LATUDA) 40 MG TABS tablet Take 40 mg by mouth daily. Patient not taking: Reported on 06/11/2022    [provider]  metroNIDAZOLE (FLAGYL) 500 MG tablet Take 1 tablet (500 mg total) by mouth 2 (two) times daily. Patient not taking: Reported on 11/12/2022 11/01/22   Orson Eva, NP  mirtazapine (REMERON) 15 MG tablet Take 15 mg by mouth at bedtime. 05/02/20   [provider]  nystatin cream (MYCOSTATIN) APPLY TO AFFECTED AREA TWICE A DAY Patient not taking: Reported on 04/25/2023 02/12/23   Scoggins, [facility], NP  oxyCODONE-acetaminophen (PERCOCET) 5-325 MG tablet Take 1 tablet by mouth every 6 (six) hours as needed for severe pain. Patient not taking: Reported on 04/25/2023 11/09/22 11/09/23  Tommi Rumps, PA-C  predniSONE (STERAPRED UNI-PAK 21 TAB) 10 MG (21) TBPK tablet As directed on packaging Patient not taking: Reported on 04/25/2023 04/23/23   Merwyn Katos, MD  Varenicline Tartrate, Starter, 0.5 MG X 11 & 1 MG X 42 TBPK TAKE AS DIRECTED  ON PACKAGE INSTRUCTIONS THEN CALL OFFICE WHEN 2ND MONTH CONTINUING PACK IS NEEDED Patient not taking: Reported on 11/01/2022 10/22/22   Orson Eva, NP    Physical Exam: Vitals:   04/25/23 0530 04/25/23 0600 04/25/23 0615 04/25/23 0630  BP: 117/74 120/83  128/73  Pulse: 81 (!) 113 83 81  Resp: (!) 23 (!) 24 (!) 25 (!) 26  Temp:      TempSrc:      SpO2: 99% 100% 99% 98%  Weight:      Height:       Physical Exam Constitutional:      Appearance: She is normal weight.     Comments: Minimal to mild increased work of breathing with BiPAP in place  HENT:     Head: Normocephalic and atraumatic.     Nose: Nose normal.     Mouth/Throat:     Mouth: Mucous membranes are moist.  Eyes:     Pupils: Pupils are equal, round, and reactive to light.  Cardiovascular:     Rate and Rhythm: Regular rhythm. Tachycardia present.  Pulmonary:     Breath sounds: Wheezing present.  Abdominal:  General: Bowel sounds are normal.  Skin:    General: Skin is warm.  Neurological:     General: No focal deficit present.  Psychiatric:        Mood and Affect: Mood normal.     Data Reviewed:  There are no new results to review at this time.  DG Chest Port 1 View CLINICAL DATA:  Shortness of breath. Labored breathing and wheezing. Recent COPD exacerbation.  EXAM: PORTABLE CHEST 1 VIEW  COMPARISON:  None Available.  FINDINGS: The heart size and mediastinal contours are within normal limits. Decreased lung volumes. Both lungs are clear. The visualized skeletal structures are unremarkable.  IMPRESSION: No active disease.  Electronically Signed   By: Signa Kell M.D.   On: 04/25/2023 05:59  Lab Results  Component Value Date   WBC 20.5 (H) 04/25/2023   HGB 14.2 04/25/2023   HCT 42.3 04/25/2023   MCV 93.8 04/25/2023   PLT 319 04/25/2023   Last metabolic panel Lab Results  Component Value Date   GLUCOSE 108 (H) 04/25/2023   NA 138 04/25/2023   K 3.6 04/25/2023   CL 103  04/25/2023   CO2 23 04/25/2023   BUN 18 04/25/2023   CREATININE 0.85 04/25/2023   GFRNONAA >60 04/25/2023   CALCIUM 9.1 04/25/2023   PHOS 2.9 06/11/2022   PROT 7.7 04/25/2023   ALBUMIN 4.2 04/25/2023   BILITOT 0.6 04/25/2023   ALKPHOS 76 04/25/2023   AST 23 04/25/2023   ALT 18 04/25/2023   ANIONGAP 12 04/25/2023    Assessment and Plan: * COPD exacerbation (HCC) Decompensated respiratory status now on BiPAP with wheezing, cough, sputum production Failed recent course of p.o. steroids and doxycycline IV Solu-Medrol Levaquin DuoNebs Continue NIPPV and supplemental oxygen as needed Discussed smoking cessation at length  Acute hypoxemic respiratory failure (HCC) Decompensated respiratory failure now requiring BiPAP in the setting of active COPD exacerbation Chest x-ray grossly within normal limits Continue COPD exacerbation treatment protocol Discussed smoking cessation at length Monitor respiratory status  SIRS (systemic inflammatory response syndrome) (HCC) Meeting SIRS criteria with transient heart rate into the 100s as well as respirations into the upper 20s in the setting of acute respiratory failure with hypoxia requiring BiPAP White count 20 with noted recent glucocorticoid use No overt source of infection noted Chest x-ray within normal limits COVID flu and RSV negative Urinalysis pending Lactate within normal limits Will otherwise continue to monitor for now Reassess as appropriate  Leukocytosis White count 20.5 on presentation Suspect secondary to recent steroid use and overlapping hemoconcentration No overt infection noted on imaging at present in setting of COPD exacerbation Will otherwise trend with treatment and hydration Reassess if patient spikes a fever or clinically declines  Tobacco dependence Still smoking 1/2 to 1 pack/day even through active COPD exacerbation Discussed cessation at length Nicotine patch Monitor    Greater than 50% was spent  in counseling and coordination of care with patient Total encounter time 80 minutes or more   Advance Care Planning:   Code Status: Full Code   Consults: None   Family Communication: No family at the bedside   Severity of Illness: The appropriate patient status for this patient is INPATIENT. Inpatient status is judged to be reasonable and necessary in order to provide the required intensity of service to ensure the patient's safety. The patient's presenting symptoms, physical exam findings, and initial radiographic and laboratory data in the context of their chronic comorbidities is felt to place them at high risk  for further clinical deterioration. Furthermore, it is not anticipated that the patient will be medically stable for discharge from the hospital within 2 midnights of admission.   * I certify that at the point of admission it is my clinical judgment that the patient will require inpatient hospital care spanning beyond 2 midnights from the point of admission due to high intensity of service, high risk for further deterioration and high frequency of surveillance required.*  Author: Floydene Flock, MD 04/25/2023 10:33 AM  For on call review [email or web address].

## 2023-04-25 NOTE — ED Notes (Signed)
This RN assisted patient to urinate in bedpan. Patient tolerated well. Patient repositioned in bed with no other needs at this time.

## 2023-04-25 NOTE — ED Notes (Signed)
Patient done eating lunch at this time. Patient placed back on Bi-Pap. O2 sat is 99% at this time. Patient denies other needs. Call light within reach.

## 2023-04-25 NOTE — ED Triage Notes (Signed)
BIB [facility] with CC of Sob that worsened this am. Discharged x2 days ago and d/x with COPD exacerbation. Auditory wheezing with labored breathing. EMS gave x2 duoneb and 125 of SoluMedrol. Pt A&O at this time.

## 2023-04-25 NOTE — Assessment & Plan Note (Signed)
White count 20.5 on presentation Suspect secondary to recent steroid use and overlapping hemoconcentration No overt infection noted on imaging at present in setting of COPD exacerbation Will otherwise trend with treatment and hydration Reassess if patient spikes a fever or clinically declines

## 2023-04-26 DIAGNOSIS — J441 Chronic obstructive pulmonary disease with (acute) exacerbation: Secondary | ICD-10-CM | POA: Diagnosis not present

## 2023-04-26 LAB — COMPREHENSIVE METABOLIC PANEL
ALT: 16 U/L (ref 0–44)
AST: 19 U/L (ref 15–41)
Albumin: 3.7 g/dL (ref 3.5–5.0)
Alkaline Phosphatase: 63 U/L (ref 38–126)
Anion gap: 8 (ref 5–15)
BUN: 15 mg/dL (ref 6–20)
CO2: 26 mmol/L (ref 22–32)
Calcium: 9.4 mg/dL (ref 8.9–10.3)
Chloride: 104 mmol/L (ref 98–111)
Creatinine, Ser: 0.7 mg/dL (ref 0.44–1.00)
GFR, Estimated: >60 mL/min (ref >60)
Glucose, Bld: 139 mg/dL — ABNORMAL HIGH (ref 70–99)
Potassium: 4.3 mmol/L (ref 3.5–5.1)
Sodium: 138 mmol/L (ref 135–145)
Total Bilirubin: 0.5 mg/dL (ref 0.3–1.2)
Total Protein: 7.2 g/dL (ref 6.5–8.1)

## 2023-04-26 LAB — CBC
HCT: 39.1 % (ref 36.0–46.0)
Hemoglobin: 13.1 g/dL (ref 12.0–15.0)
MCH: 31.3 pg (ref 26.0–34.0)
MCHC: 33.5 g/dL (ref 30.0–36.0)
MCV: 93.5 fL (ref 80.0–100.0)
Platelets: 300 10*3/uL (ref 150–400)
RBC: 4.18 MIL/uL (ref 3.87–5.11)
RDW: 12.6 % (ref 11.5–15.5)
WBC: 18.2 10*3/uL — ABNORMAL HIGH (ref 4.0–10.5)
nRBC: 0 % (ref 0.0–0.2)

## 2023-04-26 MED ORDER — HYDROCOD POLI-CHLORPHE POLI ER 10-8 MG/5ML PO SUER
5.0000 mL | Freq: Two times a day (BID) | ORAL | Status: DC | PRN
  Administered 2023-04-26 – 2023-04-27 (×3): 5 mL via ORAL
  Filled 2023-04-26 (×3): qty 5

## 2023-04-26 MED ORDER — BENZONATATE 100 MG PO CAPS
200.0000 mg | ORAL_CAPSULE | Freq: Three times a day (TID) | ORAL | Status: DC
  Administered 2023-04-26 – 2023-04-28 (×6): 200 mg via ORAL
  Filled 2023-04-26 (×6): qty 2

## 2023-04-26 MED ORDER — IPRATROPIUM-ALBUTEROL 0.5-2.5 (3) MG/3ML IN SOLN
3.0000 mL | Freq: Three times a day (TID) | RESPIRATORY_TRACT | Status: DC
  Administered 2023-04-27 – 2023-04-28 (×4): 3 mL via RESPIRATORY_TRACT
  Filled 2023-04-26 (×4): qty 3

## 2023-04-26 NOTE — Progress Notes (Signed)
Transition of Care [facility]) - Inpatient Brief Assessment   Patient Details  Name: Kim Howard MRN: [id number] Date of Birth: [birth year]  Transition of [facility]) CM/SW Contact:    Darolyn Rua, LCSW Phone Number: 04/26/2023, 9:38 AM   Clinical Narrative:  Patient presents from home with COPD exacerbation, active smoker.   [id number] Boswell, NP Insurance: [place] [employer]  Patient currently on 4L O2, please consult TOC should discharge planning needs arise and if patient will need O2 at discharge.   Transition of Care Asessment: Insurance and Status: Insurance coverage has been reviewed Patient has primary care physician: Yes Home environment has been reviewed: from home Prior level of function:: independent Prior/Current Home Services: No current home services Social Determinants of Health Reivew: SDOH reviewed no interventions necessary Readmission risk has been reviewed: Yes Transition of care needs: no transition of care needs at this time

## 2023-04-26 NOTE — Progress Notes (Signed)
Pt reports drinking 6-12 regular cans of [employer] or Pepsi per day. "I don't drink water." Spent 15 minutes in educating patient on the importance of water intake and decreasing caffeinated, high-fructose drinks. Pt agreeable and thanked me for telling her pro's and con's of water vs. Soda pop.

## 2023-04-26 NOTE — Progress Notes (Addendum)
Progress Note   Patient: Kim Howard:[id number] DOB: Sep 20, 1970 DOA: 04/25/2023     1 DOS: the patient was seen and examined on 04/26/2023   Brief hospital course:  Kim Howard is a 52 y.o. female with medical history significant of COPD, GERD, presenting with acute respiratory failure with hypoxia, COPD exacerbation.  Patient reports several days of cough, wheezing, increased sputum production.  Was noted to have been seen in the ER 2 days ago for similar symptoms.  Was placed on course of prednisone, doxycycline and inhalers.  Patient with persistence of symptoms.  Still smoking 1/2 pack plus per day despite active COPD exacerbation.  No fevers or chills.  No chest pain.  No nausea or vomiting.  Worsening respiratory status despite treatment plan.  No orthopnea or PND.  No reported alcohol use. Presented to the ER afebrile, heart rate in 100s, BP stable.  Transition to BiPAP of 40% to improve respiratory distress and increased work of breathing.  White count 20.5, hemoglobin 14, platelets 319, lactate 1.6, troponin 7, creatinine 0.85.  Chest x-ray within normal limits.        Assessment and Plan:  * COPD with acute exacerbation (HCC) Decompensated respiratory status initially requiring BiPAP due to increased work of breathing with wheezing, cough, sputum production Failed recent course of p.o. steroids and doxycycline Continue systemic steroids  Antibiotic therapy with Levaquin to complete a 3 to 5-day course of therapy Continue scheduled and as needed DuoNebs Continue NIPPV and supplemental oxygen as needed.  Patient is currently on 3 L of oxygen to maintain pulse oximetry greater than 92% Discussed smoking cessation at length   Acute hypoxemic respiratory failure (HCC) Decompensated respiratory failure initially requiring BiPAP in the setting of active COPD exacerbation Currently off BiPAP but requires oxygen supplementation at 3 L to maintain pulse oximetry greater  than 92% Chest x-ray grossly within normal limits Will attempt to wean off oxygen once acute illness improves or resolves Continue COPD exacerbation treatment protocol Discussed smoking cessation at length    SIRS (systemic inflammatory response syndrome) (HCC) Meeting SIRS criteria with transient heart rate into the 100s as well as respirations into the upper 20s in the setting of acute respiratory failure with hypoxia requiring BiPAP White count 20 with noted recent glucocorticoid use No overt source of infection noted Chest x-ray within normal limits COVID flu and RSV negative Urinalysis pending Lactate within normal limits Leukocytosis persists but shows a downward trend and is most likely related to systemic steroid administration    Leukocytosis White count 20.5 on presentation Suspect secondary to recent steroid use and overlapping hemoconcentration No overt infection noted on imaging at present in setting of COPD exacerbation Will otherwise trend with treatment and hydration Reassess if patient spikes a fever or clinically declines    Tobacco dependence Still smoking 1/2 to 1 pack/day even through active COPD exacerbation Discussed cessation at length Continue nicotine transdermal patch         Subjective: Patient is seen and examined at the bedside.  Has a nonproductive cough  Physical Exam: Vitals:   04/25/23 2300 04/26/23 0337 04/26/23 0748 04/26/23 1100  BP: 135/81  (!) 142/86 121/77  Pulse: 79   90  Resp: (!) 21   14  Temp: 98.6 F (37 C) 97.7 F (36.5 C) 97.9 F (36.6 C)   TempSrc: Oral Oral Oral   SpO2: 97%   98%  Weight:      Height:  Appearance: She is normal weight.     Comments: Minimal to mild increased work of breathing with BiPAP in place  HENT:     Head: Normocephalic and atraumatic.     Nose: Nose normal.     Mouth/Throat:     Mouth: Mucous membranes are moist.  Eyes:     Pupils: Pupils are equal, round, and reactive to light.   Cardiovascular:     Rate and Rhythm: Regular rhythm.  Pulmonary:     Breath sounds: Wheezing present.  Abdominal:     General: Bowel sounds are normal.  Skin:    General: Skin is warm.  Neurological:     General: No focal deficit present.  Psychiatric:        Mood and Affect: Mood normal.    Data Reviewed: Labs reviewed.  White count 18.2 down from 20.5.  Rest of the labs within normal limits} There are no new results to review at this time.  Family Communication: Plan of care discussed with patient at the bedside.  She verbalizes understandingand agrees with the plan  Disposition: Status is: Inpatient Remains inpatient appropriate because: Remains on oxygen supplementation and requires IV systemic steroids  Planned Discharge Destination: Home    Time spent: 35 minutes  Author: Lucile Shutters, MD 04/26/2023 3:03 PM  For on call review [email or web address].

## 2023-04-26 NOTE — Plan of Care (Signed)
Patient remains in Cardiac PCU at time of writing. No supplemental O2 requirement at this time. Continuous cardiac monitoring --> NSR. Smoking cessation encouraged by this RN.   Problem: Education: Goal: Knowledge of disease or condition will improve Outcome: Progressing Goal: Knowledge of the prescribed therapeutic regimen will improve Outcome: Progressing Goal: Individualized Educational Video(s) Outcome: Progressing   Problem: Activity: Goal: Ability to tolerate increased activity will improve Outcome: Progressing Goal: Will verbalize the importance of balancing activity with adequate rest periods Outcome: Progressing   Problem: Respiratory: Goal: Ability to maintain a clear airway will improve Outcome: Progressing Goal: Levels of oxygenation will improve Outcome: Progressing Goal: Ability to maintain adequate ventilation will improve Outcome: Progressing   Problem: Education: Goal: Knowledge of General Education information will improve Description: Including pain rating scale, medication(s)/side effects and non-pharmacologic comfort measures Outcome: Progressing   Problem: Health Behavior/Discharge Planning: Goal: Ability to manage health-related needs will improve Outcome: Progressing   Problem: Clinical Measurements: Goal: Ability to maintain clinical measurements within normal limits will improve Outcome: Progressing Goal: Will remain free from infection Outcome: Progressing Goal: Diagnostic test results will improve Outcome: Progressing Goal: Respiratory complications will improve Outcome: Progressing Goal: Cardiovascular complication will be avoided Outcome: Progressing   Problem: Activity: Goal: Risk for activity intolerance will decrease Outcome: Progressing   Problem: Nutrition: Goal: Adequate nutrition will be maintained Outcome: Progressing   Problem: Coping: Goal: Level of anxiety will decrease Outcome: Progressing   Problem:  Elimination: Goal: Will not experience complications related to bowel motility Outcome: Progressing Goal: Will not experience complications related to urinary retention Outcome: Progressing   Problem: Pain Management: Goal: General experience of comfort will improve Outcome: Progressing   Problem: Safety: Goal: Ability to remain free from injury will improve Outcome: Progressing   Problem: Skin Integrity: Goal: Risk for impaired skin integrity will decrease Outcome: Progressing

## 2023-04-27 DIAGNOSIS — J441 Chronic obstructive pulmonary disease with (acute) exacerbation: Secondary | ICD-10-CM | POA: Diagnosis not present

## 2023-04-27 LAB — BASIC METABOLIC PANEL
Anion gap: 11 (ref 5–15)
BUN: 16 mg/dL (ref 6–20)
CO2: 27 mmol/L (ref 22–32)
Calcium: 9.2 mg/dL (ref 8.9–10.3)
Chloride: 99 mmol/L (ref 98–111)
Creatinine, Ser: 0.78 mg/dL (ref 0.44–1.00)
GFR, Estimated: >60 mL/min (ref >60)
Glucose, Bld: 89 mg/dL (ref 70–99)
Potassium: 3.5 mmol/L (ref 3.5–5.1)
Sodium: 137 mmol/L (ref 135–145)

## 2023-04-27 LAB — CBC
HCT: 40.3 % (ref 36.0–46.0)
Hemoglobin: 13.3 g/dL (ref 12.0–15.0)
MCH: 31.1 pg (ref 26.0–34.0)
MCHC: 33 g/dL (ref 30.0–36.0)
MCV: 94.2 fL (ref 80.0–100.0)
Platelets: 303 10*3/uL (ref 150–400)
RBC: 4.28 MIL/uL (ref 3.87–5.11)
RDW: 12.5 % (ref 11.5–15.5)
WBC: 18.1 10*3/uL — ABNORMAL HIGH (ref 4.0–10.5)
nRBC: 0 % (ref 0.0–0.2)

## 2023-04-27 LAB — BRAIN NATRIURETIC PEPTIDE: B Natriuretic Peptide: 40 pg/mL (ref 0.0–100.0)

## 2023-04-27 MED ORDER — SODIUM CHLORIDE 0.9 % IV SOLN: INTRAVENOUS | Status: AC

## 2023-04-27 MED ORDER — SODIUM CHLORIDE 3 % IN NEBU
4.0000 mL | INHALATION_SOLUTION | Freq: Every day | RESPIRATORY_TRACT | Status: DC
  Administered 2023-04-27 – 2023-04-28 (×2): 4 mL via RESPIRATORY_TRACT
  Filled 2023-04-27 (×2): qty 4

## 2023-04-27 MED ORDER — SENNOSIDES-DOCUSATE SODIUM 8.6-50 MG PO TABS
2.0000 | ORAL_TABLET | Freq: Once | ORAL | Status: AC
  Administered 2023-04-27: 2 via ORAL
  Filled 2023-04-27: qty 2

## 2023-04-27 MED ORDER — ACETAMINOPHEN 325 MG PO TABS
650.0000 mg | ORAL_TABLET | Freq: Four times a day (QID) | ORAL | Status: DC | PRN
  Administered 2023-04-27: 650 mg via ORAL
  Filled 2023-04-27: qty 2

## 2023-04-27 MED ORDER — FUROSEMIDE 10 MG/ML IJ SOLN
40.0000 mg | Freq: Every day | INTRAMUSCULAR | Status: DC
  Administered 2023-04-27 – 2023-04-28 (×2): 40 mg via INTRAVENOUS
  Filled 2023-04-27 (×2): qty 4

## 2023-04-27 NOTE — Progress Notes (Signed)
Progress Note   Patient: Kim Howard:[id number] DOB: [birth year] DOA: 04/25/2023     2 DOS: the patient was seen and examined on 04/27/2023   Brief hospital course:  Kim Howard is a 52 y.o. female with medical history significant of COPD, GERD, presenting with acute respiratory failure with hypoxia, COPD exacerbation.  Patient reports several days of cough, wheezing, increased sputum production.  Was noted to have been seen in the ER 2 days ago for similar symptoms.  Was placed on course of prednisone, doxycycline and inhalers.  Patient with persistence of symptoms.  Still smoking 1/2 pack plus per day despite active COPD exacerbation.  No fevers or chills.  No chest pain.  No nausea or vomiting.  Worsening respiratory status despite treatment plan.  No orthopnea or PND.  No reported alcohol use. Presented to the ER afebrile, heart rate in 100s, BP stable.  Transition to BiPAP of 40% to improve respiratory distress and increased work of breathing.  White count 20.5, hemoglobin 14, platelets 319, lactate 1.6, troponin 7, creatinine 0.85.  Chest x-ray within normal limits.      Assessment and Plan:  * COPD with acute exacerbation (HCC) Decompensated respiratory status initially requiring BiPAP due to increased work of breathing with wheezing, cough, sputum production Failed recent course of p.o. steroids and doxycycline Continue systemic steroids  Antibiotic therapy with Levaquin to complete a 3 to 5-day course of therapy Continue scheduled and as needed DuoNebs Continue NIPPV and supplemental oxygen as needed.  Patient is currently on 3 L of oxygen to maintain pulse oximetry greater than 92% Discussed smoking cessation at length Appreciate pulmonary input    Acute hypoxemic respiratory failure (HCC) Decompensated respiratory failure initially requiring BiPAP in the setting of active COPD exacerbation Currently off BiPAP but requires oxygen supplementation at 3 L to maintain  pulse oximetry greater than 92% Chest x-ray grossly within normal limits Will attempt to wean off oxygen once acute illness improves or resolves Continue COPD exacerbation treatment protocol Discussed smoking cessation at length     SIRS (systemic inflammatory response syndrome) (HCC) Meeting SIRS criteria with transient heart rate into the 100s as well as respirations into the upper 20s in the setting of acute respiratory failure with hypoxia requiring BiPAP White count 20 with noted recent glucocorticoid use No overt source of infection noted Chest x-ray within normal limits COVID flu and RSV negative Urinalysis pending Lactate within normal limits Leukocytosis persists but shows a downward trend and is most likely related to systemic steroid administration      Leukocytosis White count 20.5 on presentation Suspect secondary to recent steroid use and overlapping hemoconcentration No overt infection noted on imaging at present in setting of COPD exacerbation Will otherwise trend with treatment and hydration Reassess if patient spikes a fever or clinically declines     Tobacco dependence Still smoking 1/2 to 1 pack/day even through active COPD exacerbation Discussed cessation at length Continue nicotine transdermal patch              Subjective: Continues to complain of dry cough  Physical Exam: Vitals:   04/27/23 0300 04/27/23 0400 04/27/23 0752 04/27/23 0813  BP: 124/80 126/81    Pulse:  67    Resp:  18    Temp:  97.7 F (36.5 C)  98.1 F (36.7 C)  TempSrc:  Axillary  Oral  SpO2:  95% 95%   Weight:      Height:       Appearance:  She is normal weight.     Comments: Minimal to mild increased work of breathing with BiPAP in place  HENT:     Head: Normocephalic and atraumatic.     Nose: Nose normal.     Mouth/Throat:     Mouth: Mucous membranes are moist.  Eyes:     Pupils: Pupils are equal, round, and reactive to light.  Cardiovascular:     Rate and  Rhythm: Regular rhythm.  Pulmonary:     Breath sounds: Diffuse expiratory wheezing present.  Abdominal:     General: Bowel sounds are normal.  Skin:    General: Skin is warm.  Neurological:     General: No focal deficit present.  Psychiatric:        Mood and Affect: Mood normal.    Data Reviewed: Labs reviewed.  White count 18.1, BNP 40 There are no new results to review at this time.  Family Communication: Plan of care discussed with patient  Disposition: Status is: Inpatient Remains inpatient appropriate because: Continues to have diffuse expiratory wheezes  Planned Discharge Destination: Home    Time spent: 35 minutes  Author: Lucile Shutters, MD 04/27/2023 1:46 PM  For on call review [email or web address].

## 2023-04-27 NOTE — Consult Note (Signed)
PULMONOLOGY         Date: 04/27/2023,   MRN# [id number] Kim Howard 12-04-1970     AdmissionWeight: 84.4 kg                 CurrentWeight: 84.4 kg  Referring provider: Dr Joylene Igo   CHIEF COMPLAINT:   Respiratory symptoms with recurrent hospitalization   HISTORY OF PRESENT ILLNESS   This is a 53 yo F with hx of PMH including MDD, GERD, Hpylori, tobacco smoking and COPD who came in with complaints of sub acute cough lasting appx 2-3 weeks and associated with dyspnea , rhonchi and wheezing.  She was in ER for similar symptoms few days ago and was treated with steroids, antibiotics and inhalers. She was admitted due to BIPAP requirement and progressive dyspnea. She does still actively smoke when at baseline.  Her CXR during this admission did not show infiltrate but she does have leukocytosis and tachypnea , tachycardia and grossly appreciable adventitious lung sounds.     PAST MEDICAL HISTORY   Past Medical History:  Diagnosis Date   Depression    GERD (gastroesophageal reflux disease)    Helicobacter pylori gastritis    Irritable bowel syndrome (IBS)    Tobacco use      SURGICAL HISTORY   Past Surgical History:  Procedure Laterality Date   ESOPHAGOGASTRODUODENOSCOPY (EGD) WITH PROPOFOL N/A 01/08/2018   Procedure: ESOPHAGOGASTRODUODENOSCOPY (EGD) WITH PROPOFOL;  Surgeon: Scot Jun, MD;  Location: [facility] ENDOSCOPY;  Service: Endoscopy;  Laterality: N/A;   INCISE AND DRAIN ABCESS       FAMILY HISTORY   Family History  Problem Relation Age of Onset   Prostate cancer Father      SOCIAL HISTORY   Social History   Tobacco Use   Smoking status: Every Day    Current packs/day: 1.00    Types: Cigarettes   Smokeless tobacco: Never  Vaping Use   Vaping status: Never Used  Substance Use Topics   Alcohol use: No   Drug use: Yes    Types: Marijuana     MEDICATIONS    Home Medication:    Current Medication:  Current  Facility-Administered Medications:    0.9 %  sodium chloride infusion, , Intravenous, Continuous, Agbata, Tochukwu, MD   acetaminophen (TYLENOL) tablet 650 mg, 650 mg, Oral, Q6H PRN, Agbata, Tochukwu, MD, 650 mg at 04/27/23 0916   albuterol (PROVENTIL) (2.5 MG/3ML) 0.083% nebulizer solution 2.5 mg, 2.5 mg, Nebulization, Q2H PRN, Floydene Flock, MD, 2.5 mg at 04/27/23 0419   benzonatate (TESSALON) capsule 200 mg, 200 mg, Oral, TID, Agbata, Tochukwu, MD, 200 mg at 04/27/23 3220   chlorpheniramine-HYDROcodone (TUSSIONEX) 10-8 MG/5ML suspension 5 mL, 5 mL, Oral, Q12H PRN, Agbata, Tochukwu, MD, 5 mL at 04/27/23 0536   enoxaparin (LOVENOX) injection 40 mg, 40 mg, Subcutaneous, Q24H, Floydene Flock, MD, 40 mg at 04/26/23 2109   ipratropium-albuterol (DUONEB) 0.5-2.5 (3) MG/3ML nebulizer solution 3 mL, 3 mL, Nebulization, TID, Agbata, Tochukwu, MD, 3 mL at 04/27/23 0752   levofloxacin (LEVAQUIN) IVPB 750 mg, 750 mg, Intravenous, Q24H, Floydene Flock, MD, Last Rate: 100 mL/hr at 04/27/23 0700, Infusion Verify at 04/27/23 0700   nicotine (NICODERM CQ - dosed in mg/24 hours) patch 14 mg, 14 mg, Transdermal, Daily, Floydene Flock, MD, 14 mg at 04/27/23 0823   ondansetron (ZOFRAN) tablet 4 mg, 4 mg, Oral, Q6H PRN **OR** ondansetron (ZOFRAN) injection 4 mg, 4 mg, Intravenous, Q6H PRN, Floydene Flock,  MD   [COMPLETED] methylPREDNISolone sodium succinate (SOLU-MEDROL) 40 mg/mL injection 40 mg, 40 mg, Intravenous, Q12H, 40 mg at 04/26/23 0311 **FOLLOWED BY** predniSONE (DELTASONE) tablet 40 mg, 40 mg, Oral, Q breakfast, Floydene Flock, MD, 40 mg at 04/27/23 7829   sodium chloride HYPERTONIC 3 % nebulizer solution 4 mL, 4 mL, Nebulization, Daily, Agbata, Tochukwu, MD    ALLERGIES   Penicillins     REVIEW OF SYSTEMS    Review of Systems:  Gen:  Denies  fever, sweats, chills weigh loss  HEENT: Denies blurred vision, double vision, ear pain, eye pain, hearing loss, nose bleeds, sore  throat Cardiac:  No dizziness, chest pain or heaviness, chest tightness,edema Resp:   reports dyspnea chronically  Gi: Denies swallowing difficulty, stomach pain, nausea or vomiting, diarrhea, constipation, bowel incontinence Gu:  Denies bladder incontinence, burning urine Ext:   Denies Joint pain, stiffness or swelling Skin: Denies  skin rash, easy bruising or bleeding or hives Endoc:  Denies polyuria, polydipsia , polyphagia or weight change Psych:   Denies depression, insomnia or hallucinations   Other:  All other systems negative   VS: BP 126/81 (BP Location: Right Arm)   Pulse 67   Temp 98.1 F (36.7 C) (Oral)   Resp 18   Ht 5\' 7"  (1.702 m)   Wt 84.4 kg   LMP 11/22/2017 (Exact Date)   SpO2 95%   BMI 29.13 kg/m      PHYSICAL EXAM    GENERAL:NAD, no fevers, chills, no weakness no fatigue HEAD: Normocephalic, atraumatic.  EYES: Pupils equal, round, reactive to light. Extraocular muscles intact. No scleral icterus.  MOUTH: Moist mucosal membrane. Dentition intact. No abscess noted.  EAR, NOSE, THROAT: Clear without exudates. No external lesions.  NECK: Supple. No thyromegaly. No nodules. No JVD.  PULMONARY: decreased breath sounds with mild rhonchi worse at bases bilaterally.  CARDIOVASCULAR: S1 and S2. Regular rate and rhythm. No murmurs, rubs, or gallops. No edema. Pedal pulses 2+ bilaterally.  GASTROINTESTINAL: Soft, nontender, nondistended. No masses. Positive bowel sounds. No hepatosplenomegaly.  MUSCULOSKELETAL: No swelling, clubbing, or edema. Range of motion full in all extremities.  NEUROLOGIC: Cranial nerves II through XII are intact. No gross focal neurological deficits. Sensation intact. Reflexes intact.  SKIN: No ulceration, lesions, rashes, or cyanosis. Skin warm and dry. Turgor intact.  PSYCHIATRIC: Mood, affect within normal limits. The patient is awake, alert and oriented x 3. Insight, judgment intact.       IMAGING   CXR - 04/26/23- no pneumonia no  infiltrate   ASSESSMENT/PLAN   Acute on chronic hypoxemic respiratory failure -appears to be due to COPD exacerbation , would like to rule out mycoplasmal and other etiologies listed on respiratory viral panel.  - may need to have workup for asthma as she clearly seems to have labored breathing with recurrent use of urgent care/ER/hospital at a young age -She may benefit from workup for eosinophilic COPD and apha 1 deficiency on outpatient  - agree with nebulizer therapy, agree with levofloxacin (borderline QTC prolongation at 454)  agree with steroid transition to prednisone.  -Respiratory viral panel -CRP trend   Tobacco abuse    - counseling for smoking cessation         Thank you for allowing me to participate in the care of this patient.   Patient/Family are satisfied with care plan and all questions have been answered.    Provider disclosure: Patient with at least one acute or chronic illness or injury that poses  a threat to life or bodily function and is being managed actively during this encounter.  All of the below services have been performed independently by signing provider:  review of prior documentation from internal and or external health records.  Review of previous and current lab results.  Interview and comprehensive assessment during patient visit today. Review of current and previous chest radiographs/CT scans. Discussion of management and test interpretation with health care team and patient/family.   This document was prepared using Dragon voice recognition software and may include unintentional dictation errors.     Vida Rigger, M.D.  Division of Pulmonary & Critical Care Medicine

## 2023-04-27 NOTE — Plan of Care (Signed)
  Problem: Education: Goal: Knowledge of disease or condition will improve Outcome: Progressing Goal: Knowledge of the prescribed therapeutic regimen will improve Outcome: Progressing Goal: Individualized Educational Video(s) Outcome: Progressing   Problem: Activity: Goal: Ability to tolerate increased activity will improve Outcome: Progressing Goal: Will verbalize the importance of balancing activity with adequate rest periods Outcome: Progressing   Problem: Respiratory: Goal: Ability to maintain a clear airway will improve Outcome: Progressing Goal: Levels of oxygenation will improve Outcome: Progressing Goal: Ability to maintain adequate ventilation will improve Outcome: Progressing   Problem: Education: Goal: Knowledge of General Education information will improve Description: Including pain rating scale, medication(s)/side effects and non-pharmacologic comfort measures Outcome: Progressing   Problem: Health Behavior/Discharge Planning: Goal: Ability to manage health-related needs will improve Outcome: Progressing   Problem: Clinical Measurements: Goal: Ability to maintain clinical measurements within normal limits will improve Outcome: Progressing Goal: Will remain free from infection Outcome: Progressing Goal: Diagnostic test results will improve Outcome: Progressing Goal: Respiratory complications will improve Outcome: Progressing Goal: Cardiovascular complication will be avoided Outcome: Progressing   Problem: Activity: Goal: Risk for activity intolerance will decrease Outcome: Progressing   Problem: Nutrition: Goal: Adequate nutrition will be maintained Outcome: Progressing   Problem: Coping: Goal: Level of anxiety will decrease Outcome: Progressing   Problem: Elimination: Goal: Will not experience complications related to bowel motility Outcome: Progressing Goal: Will not experience complications related to urinary retention Outcome: Progressing    Problem: Pain Management: Goal: General experience of comfort will improve Outcome: Progressing   Problem: Safety: Goal: Ability to remain free from injury will improve Outcome: Progressing   Problem: Skin Integrity: Goal: Risk for impaired skin integrity will decrease Outcome: Progressing

## 2023-04-28 DIAGNOSIS — J9601 Acute respiratory failure with hypoxia: Secondary | ICD-10-CM | POA: Diagnosis not present

## 2023-04-28 LAB — C-REACTIVE PROTEIN: CRP: <0.5 mg/dL (ref <1.0)

## 2023-04-28 MED ORDER — HYDROCOD POLI-CHLORPHE POLI ER 10-8 MG/5ML PO SUER: 5.0000 mL | Freq: Two times a day (BID) | ORAL | 0 refills | Status: AC | PRN

## 2023-04-28 MED ORDER — NICOTINE 14 MG/24HR TD PT24: 14.0000 mg | MEDICATED_PATCH | Freq: Every day | TRANSDERMAL | 0 refills | Status: DC

## 2023-04-28 MED ORDER — LEVOFLOXACIN 750 MG PO TABS: 750.0000 mg | ORAL_TABLET | Freq: Every day | ORAL | 0 refills | Status: AC

## 2023-04-28 MED ORDER — PREDNISONE 20 MG PO TABS: 40.0000 mg | ORAL_TABLET | Freq: Every day | ORAL | 0 refills | Status: AC

## 2023-04-28 MED ORDER — ALBUTEROL SULFATE (2.5 MG/3ML) 0.083% IN NEBU: 2.5000 mg | INHALATION_SOLUTION | RESPIRATORY_TRACT | 2 refills | Status: AC | PRN

## 2023-04-28 MED ORDER — ADVAIR DISKUS 100-50 MCG/ACT IN AEPB: 1.0000 | INHALATION_SPRAY | Freq: Two times a day (BID) | RESPIRATORY_TRACT | 0 refills | Status: AC

## 2023-04-28 NOTE — Discharge Summary (Signed)
Physician Discharge Summary   Patient: Kim Howard MRN: [id number] DOB: 1971/03/22  Admit date:     04/25/2023  Discharge date: 04/28/23  Discharge Physician: Zaki Gertsch   PCP: Orson Eva, NP   Recommendations at discharge:   Need to quit smoking Complete antibiotic therapy as recommended Follow-up with pulmonary as an outpatient  Discharge Diagnoses: Principal Problem:   Acute hypoxemic respiratory failure (HCC) Active Problems:   COPD exacerbation (HCC)   Leukemoid reaction   Tobacco dependence   SIRS (systemic inflammatory response syndrome) (HCC)  Resolved Problems:   * No resolved hospital problems. *  Hospital Course:  Kim Howard is a 52 y.o. female with medical history significant of COPD, GERD, presenting with acute respiratory failure with hypoxia, COPD exacerbation.  Patient reports several days of cough, wheezing, increased sputum production.  Was noted to have been seen in the ER 2 days ago for similar symptoms.  Was placed on course of prednisone, doxycycline and inhalers.  Patient with persistence of symptoms.  Still smoking 1/2 pack plus per day despite active COPD exacerbation.  No fevers or chills.  No chest pain.  No nausea or vomiting.  Worsening respiratory status despite treatment plan.  No orthopnea or PND.  No reported alcohol use. Presented to the ER afebrile, heart rate in 100s, BP stable.  Transition to BiPAP of 40% to improve respiratory distress and increased work of breathing.  White count 20.5, hemoglobin 14, platelets 319, lactate 1.6, troponin 7, creatinine 0.85.  Chest x-ray within normal limits.   Assessment and Plan:  * COPD with acute exacerbation (HCC) Decompensated respiratory status initially requiring BiPAP due to increased work of breathing with wheezing, cough, sputum production Failed recent course of p.o. steroids and doxycycline Was treated with systemic steroids, bronchodilator therapy and Levaquin Patient  initially required BiPAP but has been weaned off and is currently on room air She was assessed for home oxygen need and has no requirements at this time Appreciate pulmonary input     Acute hypoxemic respiratory failure (HCC) Decompensated respiratory failure initially requiring BiPAP in the setting of active COPD exacerbation Currently off BiPAP and has been weaned off nasal cannula  Chest x-ray grossly within normal limits Continue COPD exacerbation treatment protocol Discussed smoking cessation at length     SIRS (systemic inflammatory response syndrome) (HCC) Meeting SIRS criteria with transient heart rate into the 100s as well as respirations into the upper 20s in the setting of acute respiratory failure with hypoxia requiring BiPAP White count 20 with noted recent glucocorticoid use No overt source of infection noted Chest x-ray within normal limits COVID flu and RSV negative Urinalysis pending Lactate within normal limits Leukocytosis persists but shows a downward trend and is most likely related to systemic steroid administration       Leukocytosis Leukemoid reaction White count 20.5 on presentation Suspect secondary to recent steroid use and overlapping hemoconcentration No overt infection noted on imaging at present in setting of COPD exacerbation Will otherwise trend with treatment and hydration Reassess if patient spikes a fever or clinically declines     Tobacco dependence Still smoking 1/2 to 1 pack/day even through active COPD exacerbation Discussed cessation at length Continue nicotine transdermal patch           Consultants: Pulmonology Procedures performed: Noninvasive mechanical ventilation Disposition: Home Diet recommendation:  Discharge Diet Orders (From admission, onward)     Start     Ordered   04/28/23 0000  Diet - low  sodium heart healthy        04/28/23 1136           Cardiac diet DISCHARGE MEDICATION: Allergies as of 04/28/2023        Reactions   Penicillins Hives, Swelling   Has patient had a PCN reaction causing immediate rash, facial/tongue/throat swelling, SOB or lightheadedness with hypotension: Yes Has patient had a PCN reaction causing severe rash involving mucus membranes or skin necrosis: No Has patient had a PCN reaction that required hospitalization: Unknown Has patient had a PCN reaction occurring within the last 10 years: No If all of the above answers are "NO", then may proceed with Cephalosporin use.        Medication List     STOP taking these medications    doxycycline 50 MG capsule Commonly known as: VIBRAMYCIN   famotidine 40 MG tablet Commonly known as: PEPCID   Fluticasone-Salmeterol 55-14 MCG/ACT Aepb Commonly known as: AirDuo RespiClick 55/14 Replaced by: Advair Diskus 100-50 MCG/ACT Aepb   HYDROcodone-acetaminophen 5-325 MG tablet Commonly known as: NORCO/VICODIN   hydrOXYzine 100 MG capsule Commonly known as: VISTARIL   lurasidone 40 MG Tabs tablet Commonly known as: LATUDA   metroNIDAZOLE 500 MG tablet Commonly known as: FLAGYL   nystatin cream Commonly known as: MYCOSTATIN   oxyCODONE-acetaminophen 5-325 MG tablet Commonly known as: Percocet   predniSONE 10 MG (21) Tbpk tablet Commonly known as: STERAPRED UNI-PAK 21 TAB Replaced by: predniSONE 20 MG tablet   Varenicline Tartrate (Starter) 0.5 MG X 11 & 1 MG X 42 Tbpk       TAKE these medications    Advair Diskus 100-50 MCG/ACT Aepb Generic drug: fluticasone-salmeterol Inhale 1 puff into the lungs 2 (two) times daily. Replaces: Fluticasone-Salmeterol 55-14 MCG/ACT Aepb   albuterol 108 (90 Base) MCG/ACT inhaler Commonly known as: VENTOLIN HFA Inhale 2 puffs into the lungs every 6 (six) hours as needed for wheezing or shortness of breath.   albuterol (2.5 MG/3ML) 0.083% nebulizer solution Commonly known as: PROVENTIL Take 3 mLs (2.5 mg total) by nebulization every 4 (four) hours as needed for  wheezing or shortness of breath.   chlorpheniramine-HYDROcodone 10-8 MG/5ML Commonly known as: TUSSIONEX Take 5 mLs by mouth every 12 (twelve) hours as needed for up to 5 days for cough.   cyclobenzaprine 15 MG 24 hr capsule Commonly known as: AMRIX Take 1 capsule (15 mg total) by mouth daily.   dicyclomine 10 MG capsule Commonly known as: BENTYL Take 10 mg by mouth 4 (four) times daily.   gabapentin 100 MG capsule Commonly known as: NEURONTIN Take 1 capsule (100 mg total) by mouth 2 (two) times daily.   guaiFENesin 600 MG 12 hr tablet Commonly known as: Mucinex Take 1 tablet (600 mg total) by mouth 2 (two) times daily.   levofloxacin 750 MG tablet Commonly known as: Levaquin Take 1 tablet (750 mg total) by mouth daily for 3 days.   mirtazapine 15 MG tablet Commonly known as: REMERON Take 15 mg by mouth at bedtime.   nicotine 14 mg/24hr patch Commonly known as: NICODERM CQ - dosed in mg/24 hours Place 1 patch (14 mg total) onto the skin daily. Start taking on: April 29, 2023   omeprazole 40 MG capsule Commonly known as: PRILOSEC TAKE 1 CAPSULE BY MOUTH DAILY IN AM FOR ACID REFLUX   oxybutynin 5 MG tablet Commonly known as: DITROPAN Take 5 mg by mouth every morning.   predniSONE 20 MG tablet Commonly known as: DELTASONE Take 2  tablets (40 mg total) by mouth daily with breakfast for 5 days. Start taking on: April 29, 2023 Replaces: predniSONE 10 MG (21) Tbpk tablet   rosuvastatin 10 MG tablet Commonly known as: CRESTOR Take 10 mg by mouth at bedtime.        Discharge Exam: Filed Weights   04/25/23 0514  Weight: 84.4 kg   Appearance: She is normal weight.     Comments: Appears comfortable at rest and in no distress HENT:     Head: Normocephalic and atraumatic.     Nose: Nose normal.     Mouth/Throat:     Mouth: Mucous membranes are moist.  Eyes:     Pupils: Pupils are equal, round, and reactive to light.  Cardiovascular:     Rate and Rhythm:  Regular rhythm.  Pulmonary:     Breath sounds: Clear to auscultation bilaterally Abdominal:     General: Bowel sounds are normal.  Skin:    General: Skin is warm.  Neurological:     General: No focal deficit present.  Psychiatric:        Mood and Affect: Mood normal.    Condition at discharge: stable  The results of significant diagnostics from this hospitalization (including imaging, microbiology, ancillary and laboratory) are listed below for reference.   Imaging Studies: DG Chest Port 1 View  Result Date: 04/25/2023 CLINICAL DATA:  Shortness of breath. Labored breathing and wheezing. Recent COPD exacerbation. EXAM: PORTABLE CHEST 1 VIEW COMPARISON:  None Available. FINDINGS: The heart size and mediastinal contours are within normal limits. Decreased lung volumes. Both lungs are clear. The visualized skeletal structures are unremarkable. IMPRESSION: No active disease. Electronically Signed   By: Signa Kell M.D.   On: 04/25/2023 05:59   DG Chest Port 1 View  Result Date: 04/23/2023 CLINICAL DATA:  Shortness of breath EXAM: PORTABLE CHEST 1 VIEW COMPARISON:  06/11/2022 FINDINGS: Numerous leads and wires project over the chest. Midline trachea. Normal heart size for level of inspiration. Increased density at the left lung base could represent overlying breast tissue. Right lung is clear. No pneumothorax. IMPRESSION: Increased density at the left lung base could represent overlying breast tissue. Small volume pleural fluid and adjacent airspace disease cannot be excluded. If this is a concern, recommend PA and lateral radiographs. Electronically Signed   By: Jeronimo Greaves M.D.   On: 04/23/2023 19:00    Microbiology: Results for orders placed or performed during the hospital encounter of 04/25/23  Resp panel by RT-PCR (RSV, Flu A&B, Covid) Anterior Nasal Swab     Status: None   Collection Time: 04/25/23  5:11 AM   Specimen: Anterior Nasal Swab  Result Value Ref Range Status   SARS  Coronavirus 2 by RT PCR NEGATIVE NEGATIVE Final    Comment: (NOTE) SARS-CoV-2 target nucleic acids are NOT DETECTED.  The SARS-CoV-2 RNA is generally detectable in upper respiratory specimens during the acute phase of infection. The lowest concentration of SARS-CoV-2 viral copies this assay can detect is 138 copies/mL. A negative result does not preclude SARS-Cov-2 infection and should not be used as the sole basis for treatment or other patient management decisions. A negative result may occur with  improper specimen collection/handling, submission of specimen other than nasopharyngeal swab, presence of viral mutation(s) within the areas targeted by this assay, and inadequate number of viral copies(<138 copies/mL). A negative result must be combined with clinical observations, patient history, and epidemiological information. The expected result is Negative.  Fact Sheet for Patients:  BloggerCourse.com  Fact Sheet for Healthcare Providers:  SeriousBroker.it  This test is no t yet approved or cleared by the [place] FDA and  has been authorized for detection and/or diagnosis of SARS-CoV-2 by FDA under an Emergency Use Authorization (EUA). This EUA will remain  in effect (meaning this test can be used) for the duration of the COVID-19 declaration under Section 564(b)(1) of the Act, 21 U.S.C.section 360bbb-3(b)(1), unless the authorization is terminated  or revoked sooner.       Influenza A by PCR NEGATIVE NEGATIVE Final   Influenza B by PCR NEGATIVE NEGATIVE Final    Comment: (NOTE) The Xpert Xpress SARS-CoV-2/FLU/RSV plus assay is intended as an aid in the diagnosis of influenza from Nasopharyngeal swab specimens and should not be used as a sole basis for treatment. Nasal washings and aspirates are unacceptable for Xpert Xpress SARS-CoV-2/FLU/RSV testing.  Fact Sheet for  Patients: BloggerCourse.com  Fact Sheet for Healthcare Providers: SeriousBroker.it  This test is not yet approved or cleared by the [place] FDA and has been authorized for detection and/or diagnosis of SARS-CoV-2 by FDA under an Emergency Use Authorization (EUA). This EUA will remain in effect (meaning this test can be used) for the duration of the COVID-19 declaration under Section 564(b)(1) of the Act, 21 U.S.C. section 360bbb-3(b)(1), unless the authorization is terminated or revoked.     Resp Syncytial Virus by PCR NEGATIVE NEGATIVE Final    Comment: (NOTE) Fact Sheet for Patients: BloggerCourse.com  Fact Sheet for Healthcare Providers: SeriousBroker.it  This test is not yet approved or cleared by the [place] FDA and has been authorized for detection and/or diagnosis of SARS-CoV-2 by FDA under an Emergency Use Authorization (EUA). This EUA will remain in effect (meaning this test can be used) for the duration of the COVID-19 declaration under Section 564(b)(1) of the Act, 21 U.S.C. section 360bbb-3(b)(1), unless the authorization is terminated or revoked.  Performed at [facility], [address] [place], [place] [address]   Culture, blood (routine x 2)     Status: None (Preliminary result)   Collection Time: 04/25/23  6:03 AM   Specimen: BLOOD  Result Value Ref Range Status   Specimen Description BLOOD RIGTH [facility]  Final   Special Requests   Final    BOTTLES DRAWN AEROBIC AND ANAEROBIC Blood Culture adequate volume   Culture   Final    NO GROWTH 3 DAYS Performed at [facility], [address] [place], [place] [address]    Report Status PENDING  Incomplete  Culture, blood (routine x 2)     Status: None (Preliminary result)   Collection Time: 04/25/23  6:03 AM   Specimen: BLOOD  Result Value Ref Range Status   Specimen Description  BLOOD LEFT HAND  Final   Special Requests   Final    BOTTLES DRAWN AEROBIC AND ANAEROBIC Blood Culture adequate volume   Culture   Final    NO GROWTH 3 DAYS Performed at [facility], [address] [place], [place] [address]    Report Status PENDING  Incomplete    Labs: CBC: Recent Labs  Lab 04/23/23 1546 04/25/23 0511 04/26/23 0413 04/27/23 1007  WBC 13.0* 20.5* 18.2* 18.1*  NEUTROABS  --  12.5*  --   --   HGB 15.3* 14.2 13.1 13.3  HCT 45.6 42.3 39.1 40.3  MCV 93.8 93.8 93.5 94.2  PLT 330 319 300 303   Basic Metabolic Panel: Recent Labs  Lab 04/23/23 1546  04/25/23 0511 04/26/23 0413 04/27/23 1007  NA 135 138 138 137  K 3.9 3.6 4.3 3.5  CL 100 103 104 99  CO2 22 23 26 27   GLUCOSE 93 108* 139* 89  BUN 7 18 15 16   CREATININE 0.81 0.85 0.70 0.78  CALCIUM 9.7 9.1 9.4 9.2  MG  --  2.1  --   --    Liver Function Tests: Recent Labs  Lab 04/25/23 0511 04/26/23 0413  AST 23 19  ALT 18 16  ALKPHOS 76 63  BILITOT 0.6 0.5  PROT 7.7 7.2  ALBUMIN 4.2 3.7   CBG: No results for input(s): "GLUCAP" in the last 168 hours.  Discharge time spent: greater than 30 minutes.  Signed: Lucile Shutters, MD [facility] 04/28/2023

## 2023-04-28 NOTE — Progress Notes (Signed)
SATURATION QUALIFICATIONS: (This note is used to comply with regulatory documentation for home oxygen)  Patient Saturations on Room Air at Rest = 97%  Patient Saturations on Room Air while Ambulating = 98%  Patient Saturations on 0 Liters of oxygen while Ambulating = 98%  Please briefly explain why patient needs home oxygen: 

## 2023-04-28 NOTE — Progress Notes (Signed)
PULMONOLOGY         Date: 04/28/2023,   MRN# [id number] Kim Howard [birth year]     AdmissionWeight: 84.4 kg                 CurrentWeight: 84.4 kg  Referring provider: Dr Joylene Igo   CHIEF COMPLAINT:   Respiratory symptoms with recurrent hospitalization   HISTORY OF PRESENT ILLNESS   This is a 52 yo F with hx of PMH including MDD, GERD, Hpylori, tobacco smoking and COPD who came in with complaints of sub acute cough lasting appx 2-3 weeks and associated with dyspnea , rhonchi and wheezing.  She was in ER for similar symptoms few days ago and was treated with steroids, antibiotics and inhalers. She was admitted due to BIPAP requirement and progressive dyspnea. She does still actively smoke when at baseline.  Her CXR during this admission did not show infiltrate but she does have leukocytosis and tachypnea , tachycardia and grossly appreciable adventitious lung sounds.    04/28/23- patient is much improved she seems to have had flash pulmonary edema.  She was diuresed overnight and made >2L.   We discussed smoking cessation.     PAST MEDICAL HISTORY   Past Medical History:  Diagnosis Date   Depression    GERD (gastroesophageal reflux disease)    Helicobacter pylori gastritis    Irritable bowel syndrome (IBS)    Tobacco use      SURGICAL HISTORY   Past Surgical History:  Procedure Laterality Date   ESOPHAGOGASTRODUODENOSCOPY (EGD) WITH PROPOFOL N/A 01/08/2018   Procedure: ESOPHAGOGASTRODUODENOSCOPY (EGD) WITH PROPOFOL;  Surgeon: Scot Jun, MD;  Location: [facility] ENDOSCOPY;  Service: Endoscopy;  Laterality: N/A;   INCISE AND DRAIN ABCESS       FAMILY HISTORY   Family History  Problem Relation Age of Onset   Prostate cancer Father      SOCIAL HISTORY   Social History   Tobacco Use   Smoking status: Every Day    Current packs/day: 1.00    Types: Cigarettes   Smokeless tobacco: Never  Vaping Use   Vaping status: Never Used  Substance Use  Topics   Alcohol use: No   Drug use: Yes    Types: Marijuana     MEDICATIONS    Home Medication:    Current Medication:  Current Facility-Administered Medications:    acetaminophen (TYLENOL) tablet 650 mg, 650 mg, Oral, Q6H PRN, Agbata, Tochukwu, MD, 650 mg at 04/27/23 0916   albuterol (PROVENTIL) (2.5 MG/3ML) 0.083% nebulizer solution 2.5 mg, 2.5 mg, Nebulization, Q2H PRN, Floydene Flock, MD, 2.5 mg at 04/27/23 0419   benzonatate (TESSALON) capsule 200 mg, 200 mg, Oral, TID, Agbata, Tochukwu, MD, 200 mg at 04/28/23 2841   chlorpheniramine-HYDROcodone (TUSSIONEX) 10-8 MG/5ML suspension 5 mL, 5 mL, Oral, Q12H PRN, Agbata, Tochukwu, MD, 5 mL at 04/27/23 2119   enoxaparin (LOVENOX) injection 40 mg, 40 mg, Subcutaneous, Q24H, Floydene Flock, MD, 40 mg at 04/27/23 2119   furosemide (LASIX) injection 40 mg, 40 mg, Intravenous, Daily, Karna Christmas, Clairissa Valvano, MD, 40 mg at 04/28/23 0832   ipratropium-albuterol (DUONEB) 0.5-2.5 (3) MG/3ML nebulizer solution 3 mL, 3 mL, Nebulization, TID, Agbata, Tochukwu, MD, 3 mL at 04/28/23 0720   levofloxacin (LEVAQUIN) IVPB 750 mg, 750 mg, Intravenous, Q24H, Floydene Flock, MD, Last Rate: 100 mL/hr at 04/28/23 0536, 750 mg at 04/28/23 0536   nicotine (NICODERM CQ - dosed in mg/24 hours) patch 14 mg, 14 mg, Transdermal, Daily,  Floydene Flock, MD, 14 mg at 04/28/23 0832   ondansetron (ZOFRAN) tablet 4 mg, 4 mg, Oral, Q6H PRN **OR** ondansetron (ZOFRAN) injection 4 mg, 4 mg, Intravenous, Q6H PRN, Floydene Flock, MD   [COMPLETED] methylPREDNISolone sodium succinate (SOLU-MEDROL) 40 mg/mL injection 40 mg, 40 mg, Intravenous, Q12H, 40 mg at 04/26/23 0311 **FOLLOWED BY** predniSONE (DELTASONE) tablet 40 mg, 40 mg, Oral, Q breakfast, Floydene Flock, MD, 40 mg at 04/28/23 1610   sodium chloride HYPERTONIC 3 % nebulizer solution 4 mL, 4 mL, Nebulization, Daily, Agbata, Tochukwu, MD, 4 mL at 04/28/23 0721    ALLERGIES   Penicillins     REVIEW OF SYSTEMS     Review of Systems:  Gen:  Denies  fever, sweats, chills weigh loss  HEENT: Denies blurred vision, double vision, ear pain, eye pain, hearing loss, nose bleeds, sore throat Cardiac:  No dizziness, chest pain or heaviness, chest tightness,edema Resp:   reports dyspnea chronically  Gi: Denies swallowing difficulty, stomach pain, nausea or vomiting, diarrhea, constipation, bowel incontinence Gu:  Denies bladder incontinence, burning urine Ext:   Denies Joint pain, stiffness or swelling Skin: Denies  skin rash, easy bruising or bleeding or hives Endoc:  Denies polyuria, polydipsia , polyphagia or weight change Psych:   Denies depression, insomnia or hallucinations   Other:  All other systems negative   VS: BP 118/83   Pulse 79   Temp 98.1 F (36.7 C) (Oral)   Resp 18   Ht 5\' 7"  (1.702 m)   Wt 84.4 kg   LMP 11/22/2017 (Exact Date)   SpO2 98%   BMI 29.13 kg/m      PHYSICAL EXAM    GENERAL:NAD, no fevers, chills, no weakness no fatigue HEAD: Normocephalic, atraumatic.  EYES: Pupils equal, round, reactive to light. Extraocular muscles intact. No scleral icterus.  MOUTH: Moist mucosal membrane. Dentition intact. No abscess noted.  EAR, NOSE, THROAT: Clear without exudates. No external lesions.  NECK: Supple. No thyromegaly. No nodules. No JVD.  PULMONARY: decreased breath sounds with mild rhonchi worse at bases bilaterally.  CARDIOVASCULAR: S1 and S2. Regular rate and rhythm. No murmurs, rubs, or gallops. No edema. Pedal pulses 2+ bilaterally.  GASTROINTESTINAL: Soft, nontender, nondistended. No masses. Positive bowel sounds. No hepatosplenomegaly.  MUSCULOSKELETAL: No swelling, clubbing, or edema. Range of motion full in all extremities.  NEUROLOGIC: Cranial nerves II through XII are intact. No gross focal neurological deficits. Sensation intact. Reflexes intact.  SKIN: No ulceration, lesions, rashes, or cyanosis. Skin warm and dry. Turgor intact.  PSYCHIATRIC: Mood, affect  within normal limits. The patient is awake, alert and oriented x 3. Insight, judgment intact.       IMAGING   CXR - 04/26/23- no pneumonia no infiltrate   ASSESSMENT/PLAN   Acute on chronic hypoxemic respiratory failure -appears to be due to COPD exacerbation , would like to rule out mycoplasmal and other etiologies listed on respiratory viral panel.  - may need to have workup for asthma as she clearly seems to have labored breathing with recurrent use of urgent care/ER/hospital at a young age -She may benefit from workup for eosinophilic COPD and apha 1 deficiency on outpatient  - agree with nebulizer therapy, agree with levofloxacin (borderline QTC prolongation at 454)  agree with steroid transition to prednisone.  -Respiratory viral panel -CRP trend is low  - she had flash pulmonary edema, she now is on room air and is doing well enough to go home.    Tobacco abuse    -  counseling for smoking cessation         Thank you for allowing me to participate in the care of this patient.   Patient/Family are satisfied with care plan and all questions have been answered.    Provider disclosure: Patient with at least one acute or chronic illness or injury that poses a threat to life or bodily function and is being managed actively during this encounter.  All of the below services have been performed independently by signing provider:  review of prior documentation from internal and or external health records.  Review of previous and current lab results.  Interview and comprehensive assessment during patient visit today. Review of current and previous chest radiographs/CT scans. Discussion of management and test interpretation with health care team and patient/family.   This document was prepared using Dragon voice recognition software and may include unintentional dictation errors.     Vida Rigger, M.D.  Division of Pulmonary & Critical Care Medicine

## 2023-04-28 NOTE — Plan of Care (Signed)
  Problem: Education: Goal: Knowledge of disease or condition will improve Outcome: Adequate for Discharge Goal: Knowledge of the prescribed therapeutic regimen will improve Outcome: Adequate for Discharge Goal: Individualized Educational Video(s) Outcome: Adequate for Discharge   Problem: Activity: Goal: Ability to tolerate increased activity will improve Outcome: Adequate for Discharge Goal: Will verbalize the importance of balancing activity with adequate rest periods Outcome: Adequate for Discharge   Problem: Respiratory: Goal: Ability to maintain a clear airway will improve Outcome: Adequate for Discharge Goal: Levels of oxygenation will improve Outcome: Adequate for Discharge Goal: Ability to maintain adequate ventilation will improve Outcome: Adequate for Discharge   Problem: Education: Goal: Knowledge of General Education information will improve Description: Including pain rating scale, medication(s)/side effects and non-pharmacologic comfort measures Outcome: Adequate for Discharge   Problem: Health Behavior/Discharge Planning: Goal: Ability to manage health-related needs will improve Outcome: Adequate for Discharge   Problem: Clinical Measurements: Goal: Ability to maintain clinical measurements within normal limits will improve Outcome: Adequate for Discharge Goal: Will remain free from infection Outcome: Adequate for Discharge Goal: Diagnostic test results will improve Outcome: Adequate for Discharge Goal: Respiratory complications will improve Outcome: Adequate for Discharge Goal: Cardiovascular complication will be avoided Outcome: Adequate for Discharge   Problem: Activity: Goal: Risk for activity intolerance will decrease Outcome: Adequate for Discharge   Problem: Nutrition: Goal: Adequate nutrition will be maintained Outcome: Adequate for Discharge   Problem: Coping: Goal: Level of anxiety will decrease Outcome: Adequate for Discharge    Problem: Elimination: Goal: Will not experience complications related to bowel motility Outcome: Adequate for Discharge Goal: Will not experience complications related to urinary retention Outcome: Adequate for Discharge   Problem: Pain Management: Goal: General experience of comfort will improve Outcome: Adequate for Discharge   Problem: Safety: Goal: Ability to remain free from injury will improve Outcome: Adequate for Discharge   Problem: Skin Integrity: Goal: Risk for impaired skin integrity will decrease Outcome: Adequate for Discharge

## 2023-04-30 LAB — CULTURE, BLOOD (ROUTINE X 2)
Culture: NO GROWTH
Culture: NO GROWTH
Special Requests: ADEQUATE
Special Requests: ADEQUATE

## 2023-10-21 ENCOUNTER — Encounter: Payer: Self-pay | Admitting: Psychiatry

## 2023-10-21 ENCOUNTER — Ambulatory Visit (INDEPENDENT_AMBULATORY_CARE_PROVIDER_SITE_OTHER): Admitting: Psychiatry

## 2023-10-21 VITALS — BP 128/82 | HR 91 | Temp 98.4°F | Ht 67.0 in | Wt 171.0 lb

## 2023-10-21 DIAGNOSIS — F312 Bipolar disorder, current episode manic severe with psychotic features: Secondary | ICD-10-CM | POA: Diagnosis not present

## 2023-10-21 MED ORDER — ARIPIPRAZOLE 5 MG PO TABS: 5.0000 mg | ORAL_TABLET | Freq: Every day | ORAL | 0 refills | Status: DC
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
## 2023-10-24 ENCOUNTER — Ambulatory Visit: Admitting: Psychiatry

## 2023-10-30 ENCOUNTER — Encounter: Payer: Self-pay | Admitting: Psychiatry

## 2023-10-30 ENCOUNTER — Ambulatory Visit (INDEPENDENT_AMBULATORY_CARE_PROVIDER_SITE_OTHER): Admitting: Psychiatry

## 2023-10-30 VITALS — BP 122/86 | HR 88 | Temp 98.2°F | Ht 67.0 in | Wt 170.2 lb

## 2023-10-30 DIAGNOSIS — F312 Bipolar disorder, current episode manic severe with psychotic features: Secondary | ICD-10-CM | POA: Diagnosis not present

## 2023-10-30 DIAGNOSIS — F5105 Insomnia due to other mental disorder: Secondary | ICD-10-CM | POA: Diagnosis not present

## 2023-10-30 DIAGNOSIS — F99 Mental disorder, not otherwise specified: Secondary | ICD-10-CM | POA: Diagnosis not present

## 2023-10-30 MED ORDER — TRAZODONE HCL 50 MG PO TABS: 50.0000 mg | ORAL_TABLET | Freq: Every day | ORAL | 0 refills | Status: AC

## 2023-10-30 MED ORDER — ARIPIPRAZOLE 10 MG PO TABS: 10.0000 mg | ORAL_TABLET | Freq: Every day | ORAL | 0 refills | Status: AC

## 2023-10-30 NOTE — Progress Notes (Signed)
 BH MD/PA/NP OP Progress Note  10/30/2023 11:08 AM Kim Howard  MRN:  [id number]  Chief Complaint:  Chief Complaint  Patient presents with   Follow-up   HPI: 53 year old female presents to [facility] for follow-up.  Patient presents in a tearful and reserved manner reporting that in the lobby she felt overwhelmed and overstimulated as there was too many people in the lobby.  Patient reports that she hears own voices in her head endorsing auditory hallucinations stating that it was too much with all the voices from the people in the room in the waiting room.  Patient at this time is reporting that with the medications she is not feeling any therapeutic effects and reports no side effects or new symptoms.  Patient reports that she has been In bed and is currently annoyed because her daughter forced her out of bed in order to come to this appointment.  Patient appears annoyed and irritable while in the appointment and easily distracted.  Patient appears to be staring off frequently and frequently stating that she wished she could just go home and go to bed.  Patient reports appetite is poor stating that she just does not feel like eating once in a while with her daughter getting her [employer] once in which all she felt to do was to throw it away in which her daughter got upset with her.  Patient reports that appetite has not improved and states that she wished she could eat.  Patient also endorses that the sleep is not good right now as she is staying up all night and doing stuff around the house and then feeling sleepy throughout the day.  Patient reports that she is unable to feel tired at night and wishes she can improve her sleep.  Based on this assessment interview is recommended for the patient to be diagnosed with bipolar affective disorder currently manic severe with psychotic features, and insomnia.  Patient is recommended to increase her Abilify  to 10 mg once daily with understanding of adverse  reactions of tardive dyskinesia as well as other side effects with medication.  Patient verbalized understanding and states that she is in agreement with this plan.  Patient also to start trazodone 50 mg once a night to help promote sleep patient was encouraged that 8 to 9 PM to take the medication and to try to get in bed by 10 PM despite not feeling sleepy.  Patient verbalized understanding of sleep hygiene education in which she was encouraged not to use screen time before bed and while in bed.  Patient verbalized understanding and states she has no further questions and would just wishes to go home.  Patient will follow up in 1 week. Visit Diagnosis:    ICD-10-CM   1. Bipolar affective disorder, currently manic, severe, with psychotic features (HCC)  F31.2 ARIPiprazole  (ABILIFY ) 10 MG tablet    2. Insomnia due to other mental disorder  F51.05    F99      Problems Bipolar Affective disorder, currently Manic -Increase Abilify  to 10mg   Insomnia - Start Trazadone 50mg  once a night - Educated on sleep hygeine  Past Psychiatric History:  Previous Psych Hospitalizations: 1994, admitted for suicidal ideation while taking Seroquel a handful of pills.   Outpatient treatment: No current outpatient treatment at this time   Medications Current: - Abilify  5 mg once daily   Medication Trials:  - Previously prescribed Latuda  for 3 years she said and stopped on her own. - Previously prescribed Seroquel  before switching to Latuda .  Suicide & Violence: Denies SI, HI.  Endorses auditory hallucinations with voices being muffled but consistent.  Endorses visual hallucinations in which she sees someone's in a corner of eyes but can never make it out.   Psychotherapy: Patient not recommended for psychotherapy at this time   Legal: Denies  Past Medical History:  Past Medical History:  Diagnosis Date   Depression    GERD (gastroesophageal reflux disease)    Helicobacter pylori gastritis     Irritable bowel syndrome (IBS)    Tobacco use     Past Surgical History:  Procedure Laterality Date   ESOPHAGOGASTRODUODENOSCOPY (EGD) WITH PROPOFOL  N/A 01/08/2018   Procedure: ESOPHAGOGASTRODUODENOSCOPY (EGD) WITH PROPOFOL ;  Surgeon: Cassie Click, MD;  Location: [facility] ENDOSCOPY;  Service: Endoscopy;  Laterality: N/A;   INCISE AND DRAIN ABCESS      Family Psychiatric History: Endorses maternal grandmother having schizophrenia and depression   Family History:  Family History  Problem Relation Age of Onset   Prostate cancer Father     Social History:  Social History   Socioeconomic History   Marital status: Single    Spouse name: Not on file   Number of children: 6   Years of education: Not on file   Highest education level: 12th grade  Occupational History   Not on file  Tobacco Use   Smoking status: Every Day    Current packs/day: 1.00    Types: Cigarettes   Smokeless tobacco: Never  Vaping Use   Vaping status: Never Used  Substance and Sexual Activity   Alcohol use: No   Drug use: Yes    Types: Marijuana   Sexual activity: Not Currently  Other Topics Concern   Not on file  Social History Narrative   Lives at home, independent at baseline   Social Drivers of Health   Financial Resource Strain: Medium Risk (07/05/2023)   Received from [facility]   Overall Financial Resource Strain (CARDIA)    Difficulty of Paying Living Expenses: Somewhat hard  Food Insecurity: Food Insecurity Present (07/05/2023)   Received from [facility]   Hunger Vital Sign    Worried About Running Out of Food in the Last Year: Sometimes true    Ran Out of Food in the Last Year: Sometimes true  Transportation Needs: No Transportation Needs (07/05/2023)   Received from [facility] - Transportation    In the past 12 months, has lack of transportation kept you from medical appointments or from getting medications?: No    Lack of  Transportation (Non-Medical): No  Physical Activity: Not on file  Stress: Not on file  Social Connections: Not on file    Allergies:  Allergies  Allergen Reactions   Penicillins Hives and Swelling    Has patient had a PCN reaction causing immediate rash, facial/tongue/throat swelling, SOB or lightheadedness with hypotension: Yes Has patient had a PCN reaction causing severe rash involving mucus membranes or skin necrosis: No Has patient had a PCN reaction that required hospitalization: Unknown Has patient had a PCN reaction occurring within the last 10 years: No If all of the above answers are "NO", then may proceed with Cephalosporin use.    Metabolic Disorder Labs: No results found for: "HGBA1C", "MPG" No results found for: "PROLACTIN" No results found for: "CHOL", "TRIG", "HDL", "CHOLHDL", "VLDL", "LDLCALC" No results found for: "TSH"  Therapeutic Level Labs: No results found for: "LITHIUM" No results found  for: "VALPROATE" No results found for: "CBMZ"  Current Medications: Current Outpatient Medications  Medication Sig Dispense Refill   albuterol  (VENTOLIN  HFA) 108 (90 Base) MCG/ACT inhaler Inhale 2 puffs into the lungs every 6 (six) hours as needed for wheezing or shortness of breath. 1 each 2   ARIPiprazole  (ABILIFY ) 5 MG tablet Take 1 tablet (5 mg total) by mouth daily. 30 tablet 0   cyclobenzaprine  (AMRIX ) 15 MG 24 hr capsule Take 1 capsule (15 mg total) by mouth daily. 30 capsule 2   dicyclomine (BENTYL) 10 MG capsule Take 10 mg by mouth 4 (four) times daily.     mirtazapine  (REMERON ) 15 MG tablet Take 15 mg by mouth at bedtime.     omeprazole  (PRILOSEC) 40 MG capsule TAKE 1 CAPSULE BY MOUTH DAILY IN AM FOR ACID REFLUX 90 capsule 1   oxybutynin  (DITROPAN ) 5 MG tablet Take 5 mg by mouth every morning.     rosuvastatin  (CRESTOR ) 10 MG tablet Take 10 mg by mouth at bedtime.     albuterol  (PROVENTIL ) (2.5 MG/3ML) 0.083% nebulizer solution Take 3 mLs (2.5 mg total) by  nebulization every 4 (four) hours as needed for wheezing or shortness of breath. 75 mL 2   fluticasone -salmeterol (ADVAIR  DISKUS) 100-50 MCG/ACT AEPB Inhale 1 puff into the lungs 2 (two) times daily. 180 each 0   No current facility-administered medications for this visit.     Musculoskeletal: Strength & Muscle Tone: within normal limits Gait & Station: normal Patient leans: N/A   Psychiatric Specialty Exam: Review of Systems  Constitutional: Negative.   HENT: Negative.    Eyes: Negative.   Respiratory: Negative.    Cardiovascular: Negative.   Gastrointestinal: Negative.   Endocrine: Negative.   Genitourinary: Negative.   Musculoskeletal: Negative.   Skin: Negative.   Allergic/Immunologic: Negative.   Neurological: Negative.   Hematological: Negative.   Psychiatric/Behavioral:  Positive for behavioral problems, dysphoric mood and hallucinations. The patient is nervous/anxious.      Today's Vitals   10/30/23 1044  BP: 122/86  Pulse: 88  Temp: 98.2 F (36.8 C)  TempSrc: Temporal  SpO2: 95%  Weight: 170 lb 3.2 oz (77.2 kg)  Height: 5\' 7"  (1.702 m)   Body mass index is 26.66 kg/m.   General Appearance: Well Groomed  Eye Contact:  Good  Speech:  Clear and Coherent  Volume:  Increased  Mood:  Depressed  Affect:  Depressed, Tearful  Thought Process:  Coherent  Orientation:  Full (Time, Place, and Person)  Thought Content:  Hallucinations: Auditory Visual  Suicidal Thoughts:  No  Homicidal Thoughts:  No  Memory:  Immediate;   Good Recent;   Good Remote;   Good  Judgement:  Good  Insight:  Good  Psychomotor Activity:  Normal  Concentration:  Concentration: Good and Attention Span: Good  Recall:  Good  Fund of Knowledge:Good  Language: Good  Akathisia:  No  Handed:  Right  AIMS (if indicated):    Assets:  Financial Resources/Insurance Housing  ADL's:  Intact  Cognition: WNL  Sleep:  Poor   Screenings: GAD-7    Flowsheet Row Office Visit from 10/21/2023  in [facility]  Total GAD-7 Score 14      PHQ2-9    Flowsheet Row Office Visit from 10/21/2023 in [facility]  PHQ-2 Total Score 0  PHQ-9 Total Score 18      Flowsheet Row Office Visit from 10/21/2023 in [facility] to Hosp-Admission (Discharged) from 04/25/2023 in [facility]U ED from 04/23/2023 in [facility] Emergency Department at [facility]  C-SSRS RISK CATEGORY No Risk No Risk No Risk        Assessment and Plan:   Assessment - Diagnosis: Bipolar affective disorder, currently manic, severe, with psychotic features (HCC) [F31.2]  Insomnia due to other mental disorder [F51.05, F99]    Differential Diagnosis: Schizophrenia  - Progress: Baseline appointment - Risk Factors: Worsening symptoms, suicide risk  Plan - Medications:  Increase Abilify  10 mg once a day, for bipolar disorder with psychotic features including audio and visual hallucinations.  Pt reports this past week that she did not feel any therapeutic effects.  Pt has been educated that the medication is increased toward therapeutic dose.  Start Trazadone 50mg  once a day at night, for insomnia. Pt has been educated to take this medication for bed. Pt reports being up all night and napping through the day. Pt encouraged to take this medication 8pm-9pm and trying to get to bed by 10 even if she doesn't feel tired.   - Education: Patient educated on the importance of sleep.  Patient reports that she is not sleeping well at night and has been educated on sleep hygiene as well as providing medication trazodone in which she was educated on purpose, side effects and adverse reactions.  Patient has also been encouraged to take the medication from 8 to 9 PM, and then attempt to go to bed despite not feeling tired and attempting to go to sleep at 10 PM. - Follow-Up: Patient will  follow up in 1 week - Referrals: No referrals - Safety Planning:  The patient has been educated, if they should have suicidal thoughts with or without a plan to call 911, or go to the closest emergency department.  Pt verbalized understanding.  Pt denies firearms within the home.  Pt also agrees to call the clinic should they have worsening symptoms before the next appointment.    Patient/Guardian was advised Release of Information must be obtained prior to any record release in order to collaborate their care with an outside provider. Patient/Guardian was advised if they have not already done so to contact the registration department to sign all necessary forms in order for us  to release information regarding their care.   Consent: Patient/Guardian gives verbal consent for treatment and assignment of benefits for services provided during this visit. Patient/Guardian expressed understanding and agreed to proceed.    Arlana Labor, NP 10/30/2023, 11:08 AM

## 2023-11-14 ENCOUNTER — Encounter: Payer: Self-pay | Admitting: Gastroenterology

## 2023-11-26 ENCOUNTER — Encounter: Payer: Self-pay | Admitting: Psychiatry

## 2023-11-26 ENCOUNTER — Ambulatory Visit (INDEPENDENT_AMBULATORY_CARE_PROVIDER_SITE_OTHER): Admitting: Psychiatry

## 2023-11-26 ENCOUNTER — Other Ambulatory Visit: Payer: Self-pay

## 2023-11-26 VITALS — BP 159/96 | HR 80 | Temp 97.5°F | Ht 67.0 in | Wt 174.0 lb

## 2023-11-26 DIAGNOSIS — F5105 Insomnia due to other mental disorder: Secondary | ICD-10-CM | POA: Diagnosis not present

## 2023-11-26 DIAGNOSIS — F312 Bipolar disorder, current episode manic severe with psychotic features: Secondary | ICD-10-CM | POA: Diagnosis not present

## 2023-11-26 DIAGNOSIS — F99 Mental disorder, not otherwise specified: Secondary | ICD-10-CM | POA: Diagnosis not present

## 2023-11-26 MED ORDER — OLANZAPINE 10 MG PO TABS: 10.0000 mg | ORAL_TABLET | Freq: Every day | ORAL | 0 refills | Status: AC

## 2023-11-26 NOTE — Progress Notes (Signed)
 BH MD/PA/NP OP Progress Note  11/26/2023 9:25 AM Kim Howard  MRN:  [id number]  Chief Complaint:  Chief Complaint  Patient presents with   Follow-up   HPI: 53 year old female presenting to [facility] for follow-up.  Pt presents looking flat, and easily distracted. Pt reports that she is continuing to have poor sleep and not feeling better with her depression.  Pt reports that she is continuing to feel withdrawn and not wanting to come out of the house. She reports her daughter is helping her, and has been giving her medications when she is supposed to take it.  Pt also reports that her daughter has set alarms on her phone throughout the day so that she will stay awake. An alarm went off in the appointment in which she was startled and then returned to her flat affect.  Pt was asked whether she would be interested in participating in IOP in which she stated that location is the only requirement for her. Pt was educated of the IOP program at [facility] and educated to present at 8am to speak with a counselor to apply to the IOP program.  Due to patient report of sleep not improving. Due to pt presentation and report of poor sleep, pt will stop trazadone and abilify  and start Zyprexa 10mg  once nightly.  Pt is in agreement with treatment plan. Pt denies SI, HI, AVH.  Pt will follow-up in 2 weeks.  Visit Diagnosis:    ICD-10-CM   1. Bipolar affective disorder, currently manic, severe, with psychotic features (HCC)  F31.2 OLANZapine (ZYPREXA) 10 MG tablet    2. Insomnia due to other mental disorder  F51.05 OLANZapine (ZYPREXA) 10 MG tablet   F99       Past Psychiatric History:  Previous Psych Hospitalizations: 1994, admitted for suicidal ideation while taking Seroquel a handful of pills.   Outpatient treatment: No current outpatient treatment at this time   Medications Current: - Abilify  5 mg once daily   Medication Trials:  - Previously prescribed Latuda  for 3 years she said and stopped on her own. -  Previously prescribed Seroquel before switching to Latuda . -11/26/23, Trazadone, poor response, pt reports not sleeping well -11/26/23, Abilify , stated helping with voices but not sleeping well.    Suicide & Violence: Denies SI, HI.  Endorses auditory hallucinations with voices being muffled but consistent.  Endorses visual hallucinations in which she sees someone's in a corner of eyes but can never make it out.   Psychotherapy: Patient not recommended for psychotherapy at this time   Legal: Denies    Past Medical History:  Past Medical History:  Diagnosis Date   Anxiety    Asthma    COPD (chronic obstructive pulmonary disease) (HCC)    Depression    GERD (gastroesophageal reflux disease)    Helicobacter pylori gastritis    Irritable bowel syndrome (IBS)    Tobacco use     Past Surgical History:  Procedure Laterality Date   ESOPHAGOGASTRODUODENOSCOPY (EGD) WITH PROPOFOL  N/A 01/08/2018   Procedure: ESOPHAGOGASTRODUODENOSCOPY (EGD) WITH PROPOFOL ;  Surgeon: Cassie Click, MD;  Location: [facility] ENDOSCOPY;  Service: Endoscopy;  Laterality: N/A;   INCISE AND DRAIN ABCESS      Family Psychiatric History: Endorses maternal grandmother having schizophrenia and depression     Family History:  Family History  Problem Relation Age of Onset   Prostate cancer Father     Social History:  Social History   Socioeconomic History   Marital status: Single  Spouse name: Not on file   Number of children: 6   Years of education: Not on file   Highest education level: 12th grade  Occupational History   Not on file  Tobacco Use   Smoking status: Every Day    Current packs/day: 1.00    Types: Cigarettes   Smokeless tobacco: Never  Vaping Use   Vaping status: Never Used  Substance and Sexual Activity   Alcohol use: No   Drug use: Yes    Types: Marijuana   Sexual activity: Not Currently  Other Topics Concern   Not on file  Social History Narrative   Lives at home, independent  at baseline   Social Drivers of Health   Financial Resource Strain: Medium Risk (07/05/2023)   Received [facility]   Overall Financial Resource Strain (CARDIA)    Difficulty of Paying Living Expenses: Somewhat hard  Food Insecurity: Food Insecurity Present (07/05/2023)   Received from [facility]   Hunger Vital Sign    Worried About Running Out of Food in the Last Year: Sometimes true    Ran Out of Food in the Last Year: Sometimes true  Transportation Needs: No Transportation Needs (07/05/2023)   Received from [facility] - Transportation    In the past 12 months, has lack of transportation kept you from medical appointments or from getting medications?: No    Lack of Transportation (Non-Medical): No  Physical Activity: Not on file  Stress: Not on file  Social Connections: Not on file    Allergies:  Allergies  Allergen Reactions   Penicillins Hives and Swelling    Has patient had a PCN reaction causing immediate rash, facial/tongue/throat swelling, SOB or lightheadedness with hypotension: Yes Has patient had a PCN reaction causing severe rash involving mucus membranes or skin necrosis: No Has patient had a PCN reaction that required hospitalization: Unknown Has patient had a PCN reaction occurring within the last 10 years: No If all of the above answers are "NO", then may proceed with Cephalosporin use.    Metabolic Disorder Labs: No results found for: "HGBA1C", "MPG" No results found for: "PROLACTIN" No results found for: "CHOL", "TRIG", "HDL", "CHOLHDL", "VLDL", "LDLCALC" No results found for: "TSH"  Therapeutic Level Labs: No results found for: "LITHIUM" No results found for: "VALPROATE" No results found for: "CBMZ"  Current Medications: Current Outpatient Medications  Medication Sig Dispense Refill   albuterol  (VENTOLIN  HFA) 108 (90 Base) MCG/ACT inhaler Inhale 2 puffs into the lungs every 6 (six)  hours as needed for wheezing or shortness of breath. 1 each 2   ARIPiprazole  (ABILIFY ) 10 MG tablet Take 1 tablet (10 mg total) by mouth daily. 14 tablet 0   cyclobenzaprine  (AMRIX ) 15 MG 24 hr capsule Take 1 capsule (15 mg total) by mouth daily. 30 capsule 2   dicyclomine (BENTYL) 10 MG capsule Take 10 mg by mouth 4 (four) times daily.     mirtazapine  (REMERON ) 15 MG tablet Take 15 mg by mouth at bedtime.     omeprazole  (PRILOSEC) 40 MG capsule TAKE 1 CAPSULE BY MOUTH DAILY IN AM FOR ACID REFLUX 90 capsule 1   oxybutynin  (DITROPAN ) 5 MG tablet Take 5 mg by mouth every morning.     rosuvastatin  (CRESTOR ) 10 MG tablet Take 10 mg by mouth at bedtime.     traZODone  (DESYREL ) 50 MG tablet Take 1 tablet (50 mg total) by mouth at bedtime. 30 tablet 0  albuterol  (PROVENTIL ) (2.5 MG/3ML) 0.083% nebulizer solution Take 3 mLs (2.5 mg total) by nebulization every 4 (four) hours as needed for wheezing or shortness of breath. 75 mL 2   fluticasone -salmeterol (ADVAIR  DISKUS) 100-50 MCG/ACT AEPB Inhale 1 puff into the lungs 2 (two) times daily. 180 each 0   No current facility-administered medications for this visit.     Musculoskeletal: Strength & Muscle Tone: within normal limits Gait & Station: normal Patient leans: N/A  Psychiatric Specialty Exam: Review of Systems  Constitutional: Negative.   HENT: Negative.    Eyes: Negative.   Respiratory: Negative.    Cardiovascular: Negative.   Gastrointestinal: Negative.   Endocrine: Negative.   Genitourinary: Negative.   Musculoskeletal: Negative.   Skin: Negative.   Allergic/Immunologic: Negative.   Neurological: Negative.   Hematological: Negative.   Psychiatric/Behavioral:  Positive for dysphoric mood and sleep disturbance.     Blood pressure (!) 159/96, pulse 80, temperature (!) 97.5 F (36.4 C), temperature source Temporal, height 5\' 7"  (1.702 m), weight 174 lb (78.9 kg), last menstrual period 11/22/2017.Body mass index is 27.25 kg/m.   General Appearance: Fairly Groomed  Eye Contact:  Minimal  Speech:  Clear and Coherent  Volume:  Normal  Mood:  Depressed and Dysphoric  Affect:  Congruent  Thought Process:  Coherent  Orientation:  Full (Time, Place, and Person)  Thought Content: WDL   Suicidal Thoughts:  No  Homicidal Thoughts:  No  Memory:  Immediate;   Good Recent;   Good Remote;   Good  Judgement:  Good  Insight:  Good  Psychomotor Activity:  Normal  Concentration:  Concentration: Good and Attention Span: Good  Recall:  Good  Fund of Knowledge: Good  Language: Good  Akathisia:  No  Handed:  Right  AIMS (if indicated):   Assets:  Financial Resources/Insurance Housing Social Support  ADL's:  Intact  Cognition: WNL  Sleep:  Poor   Screenings: GAD-7    Flowsheet Row Office Visit from 10/21/2023 in [facility]  Total GAD-7 Score 14      PHQ2-9    Flowsheet Row Office Visit from 10/30/2023 in [facility] Office Visit from 10/21/2023 in [facility]  PHQ-2 Total Score 6 0  PHQ-9 Total Score 24 18      Flowsheet Row Office Visit from 10/30/2023 in [facility] Office Visit from 10/21/2023 in [facility] ED to Hosp-Admission (Discharged) from 04/25/2023 in [facility]  C-SSRS RISK CATEGORY Low Risk No Risk No Risk        Assessment and Plan:   Assessment and Plan:    Assessment - Diagnosis: Bipolar affective disorder, currently manic, severe, with psychotic features (HCC) [F31.2]  Insomnia due to other mental disorder [F51.05, F99]    Differential Diagnosis: Schizophrenia  - Progress: Baseline appointment - Risk Factors: Worsening symptoms, suicide risk  Plan - Medications:  Start Zyprexa 10mg , once daily at night. Pt educated to monitor for abnormal movements or  Tardive Diskenisia Stop Trazadone. Stop Abilify .  - Education: Patient educated on the importance of sleep.  Patient reports that she is not sleeping well at night and has been educated on sleep hygiene as well as providing medication trazodone  in which she was educated on purpose, side effects and adverse reactions.  Patient has also been encouraged to take the medication from 8 to 9 PM, and then attempt to  go to bed despite not feeling tired and attempting to go to sleep at 10 PM. - Therapy: Recommend for IOP at [facility], pt instructed to walk in to the clinic at 8am to sign up for IOP. Referral  - Follow-Up: Patient follow-up in 2 weeks.  - Referrals: No referrals - Safety Planning:  The patient has been educated, if they should have suicidal thoughts with or without a plan to call 911, or go to the closest emergency department.  Pt verbalized understanding.  Pt denies firearms within the home.  Pt also agrees to call the clinic should they have worsening symptoms before the next appointment.      Patient/Guardian was advised Release of Information must be obtained prior to any record release in order to collaborate their care with an outside provider. Patient/Guardian was advised if they have not already done so to contact the registration department to sign all necessary forms in order for us  to release information regarding their care.   Consent: Patient/Guardian gives verbal consent for treatment and assignment of benefits for services provided during this visit. Patient/Guardian expressed understanding and agreed to proceed.    Arlana Labor, NP 11/26/2023, 9:25 AM

## 2023-11-28 ENCOUNTER — Ambulatory Visit: Admit: 2023-11-28 | Admitting: Gastroenterology

## 2023-11-28 HISTORY — DX: Chronic obstructive pulmonary disease, unspecified: J44.9

## 2023-11-28 HISTORY — DX: Anxiety disorder, unspecified: F41.9

## 2023-11-28 HISTORY — DX: Unspecified asthma, uncomplicated: J45.909

## 2023-11-28 SURGERY — COLONOSCOPY
Anesthesia: General

## 2023-12-05 ENCOUNTER — Encounter: Payer: Self-pay | Admitting: Gastroenterology

## 2023-12-11 ENCOUNTER — Ambulatory Visit: Admitting: Psychiatry

## 2023-12-26 ENCOUNTER — Ambulatory Visit: Admission: RE | Admit: 2023-12-26 | Source: Ambulatory Visit | Admitting: Gastroenterology

## 2023-12-26 HISTORY — DX: Polyneuropathy, unspecified: G62.9

## 2023-12-26 HISTORY — DX: Unspecified urinary incontinence: R32

## 2023-12-26 HISTORY — DX: Deficiency of other specified B group vitamins: E53.8

## 2023-12-26 HISTORY — DX: Bipolar disorder, current episode depressed, mild: F31.31

## 2023-12-26 HISTORY — DX: Hyperlipidemia, unspecified: E78.5

## 2023-12-26 HISTORY — DX: Vitamin D deficiency, unspecified: E55.9

## 2023-12-26 SURGERY — COLONOSCOPY
Anesthesia: General

## 2023-12-31 ENCOUNTER — Other Ambulatory Visit: Payer: Self-pay | Admitting: Surgery

## 2023-12-31 DIAGNOSIS — Z1231 Encounter for screening mammogram for malignant neoplasm of breast: Secondary | ICD-10-CM

## 2023-12-31 DIAGNOSIS — N644 Mastodynia: Secondary | ICD-10-CM

## 2023-12-31 DIAGNOSIS — N611 Abscess of the breast and nipple: Secondary | ICD-10-CM

## 2023-12-31 DIAGNOSIS — R928 Other abnormal and inconclusive findings on diagnostic imaging of breast: Secondary | ICD-10-CM

## 2024-01-07 ENCOUNTER — Ambulatory Visit
Admission: RE | Admit: 2024-01-07 | Discharge: 2024-01-07 | Disposition: A | Discharge status: Home / Self Care | Payer: MEDICAID | Source: Ambulatory Visit | Attending: Surgery | Admitting: Surgery

## 2024-01-07 DIAGNOSIS — N644 Mastodynia: Secondary | ICD-10-CM | POA: Insufficient documentation

## 2024-01-07 DIAGNOSIS — Z1231 Encounter for screening mammogram for malignant neoplasm of breast: Secondary | ICD-10-CM | POA: Diagnosis present

## 2024-01-07 DIAGNOSIS — N611 Abscess of the breast and nipple: Secondary | ICD-10-CM

## 2024-02-06 ENCOUNTER — Emergency Department: Payer: MEDICAID

## 2024-02-06 ENCOUNTER — Other Ambulatory Visit: Payer: Self-pay

## 2024-02-06 ENCOUNTER — Encounter: Payer: Self-pay | Admitting: Emergency Medicine

## 2024-02-06 ENCOUNTER — Observation Stay
Admission: EM | Admit: 2024-02-06 | Discharge: 2024-02-06 | Discharge status: Against Medical Advice | Payer: MEDICAID | Attending: Emergency Medicine | Admitting: Emergency Medicine

## 2024-02-06 DIAGNOSIS — E785 Hyperlipidemia, unspecified: Secondary | ICD-10-CM | POA: Diagnosis not present

## 2024-02-06 DIAGNOSIS — E559 Vitamin D deficiency, unspecified: Secondary | ICD-10-CM | POA: Diagnosis not present

## 2024-02-06 DIAGNOSIS — M542 Cervicalgia: Secondary | ICD-10-CM | POA: Diagnosis not present

## 2024-02-06 DIAGNOSIS — R0603 Acute respiratory distress: Secondary | ICD-10-CM | POA: Diagnosis present

## 2024-02-06 DIAGNOSIS — Z1152 Encounter for screening for COVID-19: Secondary | ICD-10-CM | POA: Diagnosis not present

## 2024-02-06 DIAGNOSIS — K219 Gastro-esophageal reflux disease without esophagitis: Secondary | ICD-10-CM | POA: Diagnosis not present

## 2024-02-06 DIAGNOSIS — F319 Bipolar disorder, unspecified: Secondary | ICD-10-CM | POA: Insufficient documentation

## 2024-02-06 DIAGNOSIS — J441 Chronic obstructive pulmonary disease with (acute) exacerbation: Secondary | ICD-10-CM | POA: Diagnosis not present

## 2024-02-06 LAB — COMPREHENSIVE METABOLIC PANEL WITH GFR
ALT: 13 U/L (ref 0–44)
AST: 26 U/L (ref 15–41)
Albumin: 3.8 g/dL (ref 3.5–5.0)
Alkaline Phosphatase: 67 U/L (ref 38–126)
Anion gap: 11 (ref 5–15)
BUN: 7 mg/dL (ref 6–20)
CO2: 20 mmol/L — ABNORMAL LOW (ref 22–32)
Calcium: 9.2 mg/dL (ref 8.9–10.3)
Chloride: 108 mmol/L (ref 98–111)
Creatinine, Ser: 0.71 mg/dL (ref 0.44–1.00)
GFR, Estimated: >60 mL/min (ref >60)
Glucose, Bld: 120 mg/dL — ABNORMAL HIGH (ref 70–99)
Potassium: 4 mmol/L (ref 3.5–5.1)
Sodium: 139 mmol/L (ref 135–145)
Total Bilirubin: 0.9 mg/dL (ref 0.0–1.2)
Total Protein: 7.7 g/dL (ref 6.5–8.1)

## 2024-02-06 LAB — RESPIRATORY PANEL BY PCR

## 2024-02-06 LAB — CBC
HCT: 41.9 % (ref 36.0–46.0)
Hemoglobin: 14 g/dL (ref 12.0–15.0)
MCH: 31.1 pg (ref 26.0–34.0)
MCHC: 33.4 g/dL (ref 30.0–36.0)
MCV: 93.1 fL (ref 80.0–100.0)
Platelets: 304 K/uL (ref 150–400)
RBC: 4.5 MIL/uL (ref 3.87–5.11)
RDW: 11.8 % (ref 11.5–15.5)
WBC: 9.6 K/uL (ref 4.0–10.5)
nRBC: 0 % (ref 0.0–0.2)

## 2024-02-06 LAB — TROPONIN I (HIGH SENSITIVITY)
Troponin I (High Sensitivity): 2 ng/L (ref <18)
Troponin I (High Sensitivity): 3 ng/L (ref <18)

## 2024-02-06 LAB — PROCALCITONIN: Procalcitonin: <0.1 ng/mL

## 2024-02-06 LAB — RESP PANEL BY RT-PCR (RSV, FLU A&B, COVID)  RVPGX2
Influenza A by PCR: NEGATIVE
Influenza B by PCR: NEGATIVE
Resp Syncytial Virus by PCR: NEGATIVE
SARS Coronavirus 2 by RT PCR: NEGATIVE

## 2024-02-06 LAB — BRAIN NATRIURETIC PEPTIDE: B Natriuretic Peptide: 31.6 pg/mL (ref 0.0–100.0)

## 2024-02-06 LAB — LACTIC ACID, PLASMA
Lactic Acid, Venous: 1.8 mmol/L (ref 0.5–1.9)
Lactic Acid, Venous: 1.8 mmol/L (ref 0.5–1.9)

## 2024-02-06 LAB — HIV ANTIBODY (ROUTINE TESTING W REFLEX): HIV Screen 4th Generation wRfx: NONREACTIVE

## 2024-02-06 MED ORDER — HEPARIN SODIUM (PORCINE) 5000 UNIT/ML IJ SOLN: 5000.0000 [IU] | Freq: Three times a day (TID) | INTRAMUSCULAR | Status: DC

## 2024-02-06 MED ORDER — IPRATROPIUM-ALBUTEROL 0.5-2.5 (3) MG/3ML IN SOLN
3.0000 mL | Freq: Once | RESPIRATORY_TRACT | Status: AC
  Administered 2024-02-06: 3 mL via RESPIRATORY_TRACT

## 2024-02-06 MED ORDER — IPRATROPIUM-ALBUTEROL 0.5-2.5 (3) MG/3ML IN SOLN
3.0000 mL | Freq: Once | RESPIRATORY_TRACT | Status: AC
  Administered 2024-02-06: 3 mL via RESPIRATORY_TRACT
  Filled 2024-02-06: qty 6

## 2024-02-06 MED ORDER — IPRATROPIUM-ALBUTEROL 0.5-2.5 (3) MG/3ML IN SOLN
3.0000 mL | Freq: Once | RESPIRATORY_TRACT | Status: AC
  Administered 2024-02-06: 3 mL via RESPIRATORY_TRACT
  Filled 2024-02-06: qty 3

## 2024-02-06 MED ORDER — METHYLPREDNISOLONE SODIUM SUCC 125 MG IJ SOLR
125.0000 mg | Freq: Every day | INTRAMUSCULAR | Status: DC
  Administered 2024-02-06: 125 mg via INTRAVENOUS
  Filled 2024-02-06: qty 2

## 2024-02-06 NOTE — ED Notes (Signed)
 Dezii, MD at bedside.

## 2024-02-06 NOTE — ED Notes (Signed)
 Hand off completed by the floor for admission to 147. ED Medic Rosina called RT for transport and they notified the assigned floor does not take BIPAP. This nurse notifying Dezii, MD to review LOC.

## 2024-02-06 NOTE — ED Notes (Signed)
 Presents from [facility] with some diff  Pt is diaphoretic  Resp labored   Wheezing noted bilateral

## 2024-02-06 NOTE — ED Triage Notes (Addendum)
 Pt via POV from [facility] for respiratory distress. Pt states she had SOB this morning when she woke up and mid-sternal CP. Pt has a hx of COPD and asthma. Pt tried 2 breathing treatments at home with no relief. Pt has expiratory wheezing and dry cough. Pt is in distress on arrival, labored breathing and unable to speak in complete sentences.

## 2024-02-06 NOTE — ED Provider Notes (Signed)
 [facility] Provider Note    Event Date/Time   First MD Initiated Contact with Patient 02/06/24 1056     (approximate)   History   Respiratory Distress   HPI  Kim Howard is a 53 y.o. female with a past medical history of asthma/COPD, tobacco dependence, history of acute hypoxemic respiratory failure who presents today for evaluation of shortness of breath.  Patient reports that her symptoms began on Monday as what she thought was a cold.  She reports that she thinks that she has had a fever.  She has been using her nebulizer and inhaler since then.  She has not had any chest pain but reports chest tightness.  No recent trips or travel.  No history of PE or DVT.  Has not had any lower extremity swelling.  Patient still smokes half a pack per day.  Patient Active Problem List   Diagnosis Date Noted   COPD exacerbation (HCC) 04/25/2023   Leukemoid reaction 04/25/2023   SIRS (systemic inflammatory response syndrome) (HCC) 04/25/2023   Radial styloid tenosynovitis 01/31/2023   Adhesive capsulitis of left shoulder 12/21/2022   Acute pain of right shoulder 11/12/2022   Pain in joint of right shoulder 11/12/2022   Vaginitis 08/07/2022   Adverse skin reaction due to herbal medication 08/07/2022   Acute hypoxemic respiratory failure (HCC) 06/11/2022   Tobacco dependence 06/11/2022   Influenza A 06/11/2022   Musculoskeletal chest pain 06/11/2022   Coughing 06/11/2022   Shortness of breath 06/11/2022   Diarrhea 06/11/2022   Pneumonitis 05/31/2017   H. pylori infection 05/14/2017          Physical Exam   Triage Vital Signs: ED Triage Vitals  Encounter Vitals Group     BP 02/06/24 1052 (!) 129/107     Girls Systolic BP Percentile --      Girls Diastolic BP Percentile --      Boys Systolic BP Percentile --      Boys Diastolic BP Percentile --      Pulse Rate 02/06/24 1052 (!) 132     Resp 02/06/24 1052 (!) 26     Temp --      Temp src --       SpO2 02/06/24 1052 98 %     Weight 02/06/24 1054 173 lb 15.1 oz (78.9 kg)     Height 02/06/24 1054 5' 7 (1.702 m)     Head Circumference --      Peak Flow --      Pain Score --      Pain Loc --      Pain Education --      Exclude from Growth Chart --     Most recent vital signs: Vitals:   02/06/24 1108 02/06/24 1149  BP:  127/86  Pulse: 92 76  Resp:  16  SpO2:  100%    Physical Exam Vitals and nursing note reviewed.  Constitutional:      General: Awake and alert.  In active distress    Appearance: Normal appearance. The patient is normal weight.  HENT:     Head: Normocephalic and atraumatic.     Mouth: Mucous membranes are moist.  Eyes:     General: PERRL. Normal EOMs        Right eye: No discharge.        Left eye: No discharge.     Conjunctiva/sclera: Conjunctivae normal.  Cardiovascular:     Rate and Rhythm: Tachycardic rate and  regular rhythm.     Pulses: Normal pulses.  Pulmonary:     Effort: Pulmonary effort is labored.  Able to speak in 1 or 2 word sentences only.  Accessory muscle use present.    Breath sounds: Inspiratory and expiratory wheezing audibly Abdominal:     Abdomen is soft. There is no abdominal tenderness. No rebound or guarding. No distention. Musculoskeletal:        General: No swelling. Normal range of motion.     Cervical back: Normal range of motion and neck supple.  No lower extremity edema Skin:    General: Skin is warm and dry.     Capillary Refill: Capillary refill takes less than 2 seconds.     Findings: No rash.  Neurological:     Mental Status: The patient is awake and alert.      ED Results / Procedures / Treatments   Labs (all labs ordered are listed, but only abnormal results are displayed) Labs Reviewed  COMPREHENSIVE METABOLIC PANEL WITH GFR - Abnormal; Notable for the following components:      Result Value   CO2 20 (*)    Glucose, Bld 120 (*)    All other components within normal limits  RESP PANEL BY RT-PCR  (RSV, FLU A&B, COVID)  RVPGX2  RESPIRATORY PANEL BY PCR  MRSA NEXT GEN BY PCR, NASAL  CBC  BRAIN NATRIURETIC PEPTIDE  LACTIC ACID, PLASMA  LACTIC ACID, PLASMA  HIV ANTIBODY (ROUTINE TESTING W REFLEX)  PROCALCITONIN  TROPONIN I (HIGH SENSITIVITY)  TROPONIN I (HIGH SENSITIVITY)     EKG     RADIOLOGY I independently reviewed and interpreted imaging and agree with radiologists findings.     PROCEDURES:  Critical Care performed:   .Critical Care  Performed by: Tamicka Shimon E, PA-C Authorized by: Iniya Matzek E, PA-C   Critical care provider statement:    Critical care time (minutes):  45   Critical care time was exclusive of:  Separately billable procedures and treating other patients   Critical care was necessary to treat or prevent imminent or life-threatening deterioration of the following conditions:  Respiratory failure   Critical care was time spent personally by me on the following activities:  Blood draw for specimens, discussions with consultants, evaluation of patient's response to treatment, examination of patient, interpretation of cardiac output measurements, obtaining history from patient or surrogate, ordering and performing treatments and interventions, ordering and review of laboratory studies, ordering and review of radiographic studies, pulse oximetry, re-evaluation of patient's condition and review of old charts   I assumed direction of critical care for this patient from another provider in my specialty: no     Care discussed with: admitting provider      MEDICATIONS ORDERED IN ED: Medications  methylPREDNISolone  sodium succinate (SOLU-MEDROL ) 125 mg/2 mL injection 125 mg (125 mg Intravenous Given 02/06/24 1106)  heparin  injection 5,000 Units (has no administration in time range)  ipratropium-albuterol  (DUONEB) 0.5-2.5 (3) MG/3ML nebulizer solution 3 mL (3 mLs Nebulization Given 02/06/24 1102)  ipratropium-albuterol  (DUONEB) 0.5-2.5 (3) MG/3ML nebulizer  solution 3 mL (3 mLs Nebulization Given 02/06/24 1122)  ipratropium-albuterol  (DUONEB) 0.5-2.5 (3) MG/3ML nebulizer solution 3 mL (3 mLs Nebulization Given 02/06/24 1102)     IMPRESSION / MDM / ASSESSMENT AND PLAN / ED COURSE  I reviewed the triage vital signs and the nursing notes.   Differential diagnosis includes, but is not limited to, COPD exacerbation, asthma exacerbation, pulmonary embolism, heart failure, URI, COVID-19  I reviewed the  patient's chart.  Patient was seen in the emergency department on 10/24 with the same complaints..  Patient presents to the emergency department tachycardic, tachypneic, and in obvious distress.  She is unable to speak in more than 1 or 2 word sentences.  She has obvious accessory muscle use.  She maintains a normal oxygen saturation.  Patient was immediately given stacked DuoNebs and Solu-Medrol , and respiratory therapy was called for initiation of BiPAP therapy.  Upon reevaluation, patient reports that she feels markedly improved with the BiPAP.  Labs obtained are overall reassuring including normal CBC, normal troponin, normal lactate.  Chest x-ray reveals no focal consolidation or pneumothorax.  COVID/flu/RSV is negative.  Given her respiratory distress and need for BiPAP, I recommended admission to the hospitalist.  Patient is in agreement.  I consulted hospitalist for admission.  Patient was accepted by the hospitalist service.  Patient was discussed with and also seen by Dr. Claudene who agrees with assessment and plan.  Patient's presentation is most consistent with acute presentation with potential threat to life or bodily function.   Clinical Course as of 02/06/24 1259  Thu Feb 06, 2024  1123 Called respiratory therapy  [JP]    Clinical Course User Index [JP] Jaidon Sponsel E, PA-C     FINAL CLINICAL IMPRESSION(S) / ED DIAGNOSES   Final diagnoses:  COPD exacerbation (HCC)  Respiratory distress     Rx / DC Orders   ED Discharge Orders      None        Note:  This document was prepared using Dragon voice recognition software and may include unintentional dictation errors.   Tikia Skilton E, PA-C 02/06/24 1259    Claudene Rover, MD 02/06/24 1452

## 2024-02-06 NOTE — Discharge Summary (Signed)
   PT LEFT AMA DISCHARGE SUMMARY  ALIMA NASER MRN - [id number] DOB - 1971-04-15  Date of Admission - 02/06/2024 Date LEFT AMA: 02/06/2024  Attending Physician:  Lorane Poland, DO  Patient's PCP:  Glennon Sand, NP  Disposition: LEFT AMA  Follow-up Appts:  Not able to be arranged or discussed as pt LEFT AMA  Diagnoses at time pt LEFT AMA: COPD exacerbation  Hospital Course:  Kim Howard is a 53 y.o. female with medical history significant of COPD, tobacco abuse (1pk/3 days), Bipolar disorder, GERD, HLD, vitamin D deficiency, cervicalgia who presents from [facility] walk-in clinic due to dyspnea. Dyspnea began Monday after going to a friends house where she believes she caught a cold. Has been taking OTC cold medicine without relief. Has been using albuterol  and nebulizer for the last 24hrs without benefit. On arrival patient was tachycardic, dyspneic, tachypneic.  She required BiPAP and Solu-Medrol  125.  She had significant improvement after that.  She then reported she felt much better and self removed her Bipap.I advised the patient that she should be admitted for observation, continued IV steroids and breathing treatments.  The patient reported she wanted to leave AGAINST MEDICAL ADVICE.  Patient demonstrated capacity. I discussed with her that leaving AGAINST MEDICAL ADVICE would put her at risk of respiratory failure and she may not make it back to the hospital in time.  Patient endorses understanding and is aware of the risks of leaving AMA including, further respiratory compromise, including death.  She ultimately signed AMA paperwork and left the ER prior to receiving a hospital room.   Unable to treat the patient for the following conditions as she  left AMA.   COPD - s/p 125mg  solumedrol and Bipap. Pt removed bipap and reported feeling well enough to leave. Bipolar disorder GERD Hyperlipidemia Vitamin D deficiency Cervicalgia      Medication List    Unable to be  finalized as pt LEFT AMA  Day of Discharge Wt Readings from Last 3 Encounters:  02/06/24 78.9 kg  04/25/23 84.4 kg  04/23/23 78 kg   Temp Readings from Last 3 Encounters:  02/06/24 98.7 F (37.1 C) (Oral)  04/28/23 97.9 F (36.6 C) (Oral)  04/23/23 98.6 F (37 C) (Oral)   BP Readings from Last 3 Encounters:  02/06/24 115/86  04/28/23 118/84  04/23/23 (!) 154/116   Pulse Readings from Last 3 Encounters:  02/06/24 (!) 144  04/28/23 (!) 109  04/23/23 (!) 102   2:17 PM 02/06/24  Lorane Poland, DO [facility] Office  [phone]

## 2024-02-06 NOTE — ED Notes (Signed)
 Medic Rosina notified this RN that the floor does not take pts on bipap.

## 2024-02-06 NOTE — ED Notes (Addendum)
 Patient advised this nurse she would like to leave AMA. MD Dezii notified of the same and to consult with patient.

## 2024-02-06 NOTE — ED Notes (Signed)
 Called to floor to give report and per Camie no nurse assigned at this time. Number provided for call back.

## 2024-02-06 NOTE — H&P (Signed)
 History and Physical    Patient: Kim Howard FMW:[id number] DOB: 09/12/1970 DOA: 02/06/2024 DOS: the patient was seen and examined on 02/06/2024 PCP: Glennon Sand, NP  Patient coming from: Home  Chief Complaint:  Chief Complaint  Patient presents with   Respiratory Distress   HPI: Kim Howard is a 53 y.o. female with medical history significant of COPD, tobacco abuse (1pk/3 days), Bipolar disorder, GERD, HLD, vitamin D deficiency, cervicalgia who presents from [facility] walk-in clinic due to dyspnea. Dyspnea began Monday after going to a friends house where she believes she caught a cold. Has been taking OTC cold medicine without relief. Has been using albuterol  and nebulizer for the last 24hrs without benefit. On arrival patient was tachycardic, dyspneic, tachypneic.  She required BiPAP and Solu-Medrol  125.  She had significant improvement after that.  She then reported she felt much better and self removed her Bipap.I advised the patient that she should be admitted for observation, continued IV steroids and breathing treatments.  The patient reported she wanted to leave AGAINST MEDICAL ADVICE.  Patient demonstrated capacity. I discussed with her that leaving AGAINST MEDICAL ADVICE would put her at risk of respiratory failure and she may not make it back to the hospital in time.  Patient endorses understanding and is aware of the risks of leaving AMA including, further respiratory compromise, including death.  She ultimately signed AMA paperwork and left the ER prior to receiving a hospital room.  Unable to treat the patient for the following conditions as she  left AMA.  COPD - s/p 125mg  solumedrol and Bipap. Pt removed bipap and reported feeling well enough to leave. Bipolar disorder GERD Hyperlipidemia Vitamin D deficiency Cervicalgia      Review of Systems: As mentioned in the history of present illness. All other systems reviewed and are negative. Past Medical History:   Diagnosis Date   Anxiety    Asthma    B12 deficiency    Bipolar affective disorder, currently depressed, mild (HCC)    COPD (chronic obstructive pulmonary disease) (HCC)    Depression    GERD (gastroesophageal reflux disease)    Helicobacter pylori gastritis    Hyperlipidemia    Irritable bowel syndrome (IBS)    Neuropathy    Tobacco use    Urinary incontinence    Vitamin D deficiency    Past Surgical History:  Procedure Laterality Date   ESOPHAGOGASTRODUODENOSCOPY (EGD) WITH PROPOFOL  N/A 01/08/2018   Procedure: ESOPHAGOGASTRODUODENOSCOPY (EGD) WITH PROPOFOL ;  Surgeon: Viktoria Lamar DASEN, MD;  Location: [facility] ENDOSCOPY;  Service: Endoscopy;  Laterality: N/A;   INCISE AND DRAIN ABCESS     Social History:  reports that she has been smoking cigarettes. She has never used smokeless tobacco. She reports current drug use. Drug: Marijuana. She reports that she does not drink alcohol.  Allergies  Allergen Reactions   Penicillins Hives and Swelling    Has patient had a PCN reaction causing immediate rash, facial/tongue/throat swelling, SOB or lightheadedness with hypotension: Yes Has patient had a PCN reaction causing severe rash involving mucus membranes or skin necrosis: No Has patient had a PCN reaction that required hospitalization: Unknown Has patient had a PCN reaction occurring within the last 10 years: No If all of the above answers are NO, then may proceed with Cephalosporin use.    Family History  Problem Relation Age of Onset   Prostate cancer Father     Prior to Admission medications   Medication Sig Start Date End Date Taking? Authorizing  Provider  albuterol  (PROVENTIL ) (2.5 MG/3ML) 0.083% nebulizer solution Take 3 mLs (2.5 mg total) by nebulization every 4 (four) hours as needed for wheezing or shortness of breath. 04/28/23 05/28/23  Lanetta Lingo, MD  albuterol  (VENTOLIN  HFA) 108 (90 Base) MCG/ACT inhaler Inhale 2 puffs into the lungs every 6 (six) hours as needed  for wheezing or shortness of breath. 06/15/22   Ghimire, Kuber, MD  ARIPiprazole  (ABILIFY ) 10 MG tablet Take 1 tablet (10 mg total) by mouth daily. 10/30/23   Saucier, Dorn Ruth, NP  cyclobenzaprine  (AMRIX ) 15 MG 24 hr capsule Take 1 capsule (15 mg total) by mouth daily. 01/29/23   Orlean Alan HERO, FNP  dicyclomine (BENTYL) 10 MG capsule Take 10 mg by mouth 4 (four) times daily.    [provider]  fluticasone -salmeterol (ADVAIR  DISKUS) 100-50 MCG/ACT AEPB Inhale 1 puff into the lungs 2 (two) times daily. 04/28/23 07/27/23  Lanetta Lingo, MD  mirtazapine  (REMERON ) 15 MG tablet Take 15 mg by mouth at bedtime. 05/02/20   [provider]  OLANZapine  (ZYPREXA ) 10 MG tablet Take 1 tablet (10 mg total) by mouth at bedtime. 11/26/23   Saucier, Dorn Ruth, NP  omeprazole  (PRILOSEC) 40 MG capsule TAKE 1 CAPSULE BY MOUTH DAILY IN AM FOR ACID REFLUX 10/22/22   Boswell, Chelsa, NP  oxybutynin  (DITROPAN ) 5 MG tablet Take 5 mg by mouth every morning. 05/22/22   [provider]  rosuvastatin  (CRESTOR ) 10 MG tablet Take 10 mg by mouth at bedtime. 01/24/22   [provider]  traZODone  (DESYREL ) 50 MG tablet Take 1 tablet (50 mg total) by mouth at bedtime. 10/30/23   Saucier, Dorn Ruth, NP    Physical Exam: Vitals:   02/06/24 1052 02/06/24 1054 02/06/24 1108 02/06/24 1149  BP: (!) 129/107   127/86  Pulse: (!) 132  92 76  Resp: (!) 26   16  SpO2: 98%   100%  Weight:  78.9 kg    Height:  5' 7 (1.702 m)     Constitutional:  Normal appearance. Non toxic-appearing.  HENT: Head Normocephalic and atraumatic.  Mucous membranes are moist.  Eyes:  Extraocular intact. Conjunctivae normal. Pupils are equal, round, and reactive to light.  Cardiovascular: Rate and Rhythm: Normal rate and regular rhythm.  Pulmonary: BiPAP in place.  Poor aeration on the right side, no wheezing left side. Not tachypneic Musculoskeletal:  Normal range of motion.  Skin: warm and dry. not jaundiced.   Neurological: No focal deficit present. alert. Oriented. Psychiatric: Mood and Affect congruent.   Data Reviewed:     Latest Ref Rng & Units 02/06/2024   10:55 AM 04/27/2023   10:07 AM 04/26/2023    4:13 AM  CBC  WBC 4.0 - 10.5 K/uL 9.6  18.1  18.2   Hemoglobin 12.0 - 15.0 g/dL 85.9  86.6  86.8   Hematocrit 36.0 - 46.0 % 41.9  40.3  39.1   Platelets 150 - 400 K/uL 304  303  300       Latest Ref Rng & Units 02/06/2024   10:55 AM 04/27/2023   10:07 AM 04/26/2023    4:13 AM  BMP  Glucose 70 - 99 mg/dL 879  89  860   BUN 6 - 20 mg/dL 7  16  15    Creatinine 0.44 - 1.00 mg/dL 9.28  9.21  9.29   Sodium 135 - 145 mmol/L 139  137  138   Potassium 3.5 - 5.1 mmol/L 4.0  3.5  4.3  Chloride 98 - 111 mmol/L 108  99  104   CO2 22 - 32 mmol/L 20  27  26    Calcium  8.9 - 10.3 mg/dL 9.2  9.2  9.4      Advance Care Planning:   Code Status: Full Code   Consults: n/a  Family Communication: n/a  Severity of Illness: Patient was admitted for observation but left AMA prior to bed assignment  Author: Romulus Hanrahan, DO 02/06/2024 12:20 PM  For on call review [email or web address].

## 2024-04-20 ENCOUNTER — Ambulatory Visit: Payer: Self-pay | Admitting: Surgery

## 2024-04-20 NOTE — H&P (View-Only) (Signed)
 Subjective:   CC: Epidermal cyst [L72.0]  HPI:  referred by Selinda Azalee Quan, * for evaluation of above.   History of Present Illness Kim Howard is a 53 year old female who presents with a persistent bump on her eyelid. She was referred by Dr. Quan for evaluation of a persistent bump on her eyelid.  The bump on her eyelid has persisted despite antibiotic treatment, which only reduced its size. It remains painful, particularly with pressure during sleep, and is associated with periorbital discoloration and continuous tearing. She has a history of similar facial lesions that were excised, including one behind her ear. She denies long-term antibiotic use and does not take blood thinners, including aspirin products.    Past Medical History:  has a past medical history of Anxiety, Asthma, unspecified asthma severity, unspecified whether complicated, unspecified whether persistent (HHS-HCC), COPD (chronic obstructive pulmonary disease) (CMS/HHS-HCC), Depression, and Hypertension.  Past Surgical History:  has a past surgical history that includes sebaceous cyst and egd (01/08/2018).  Family History: family history includes No Known Problems in her brother, father, and mother.  Social History:  reports that she has been smoking cigarettes. She started smoking about 36 years ago. She has a 18.2 pack-year smoking history. She has never used smokeless tobacco. She reports current drug use. She reports that she does not drink alcohol.  Current Medications: has a current medication list which includes the following prescription(s): albuterol , albuterol  mdi (proventil , ventolin , proair ) hfa, aripiprazole , cetirizine, olanzapine , omeprazole , oxybutynin , polyethylene glycol, rosuvastatin , sucralfate , trazodone , and mirtazapine .  Allergies:  Allergies  Allergen Reactions  . Penicillin Hives  . Penicillins Nausea And Vomiting    ROS:  A 15 point review of systems was performed and pertinent  positives and negatives noted in HPI   Objective:     LMP  (LMP Unknown)   Constitutional :  No distress, cooperative, alert  Lymphatics/Throat:  Supple with no lymphadenopathy  Respiratory:  Clear to auscultation bilaterally  Cardiovascular:  Regular rate and rhythm  Gastrointestinal: Soft, non-tender, non-distended, no organomegaly.  Musculoskeletal: Steady gait and movement  Skin: Cool and moist, raised lesion lateral to left lower eyelid, with no obvious swelling, erythema, discharge.  Looks more like folliculitis but possibly an epidermal cyst.  Unable to palpate due to patient tenderness  Psychiatric: Normal affect, non-agitated, not confused         LABS:  N/a   RADS: N/a  Assessment:      Epidermal cyst [L72.0]  Plan:     1. Epidermal cyst [L72.0] Discussed surgical excision.  Alternatives include continued observation.  Benefits include possible symptom relief, pathologic evaluation, improved cosmesis. Discussed the risk of surgery including recurrence, chronic pain, post-op infxn, poor cosmesis, poor/delayed wound healing, and possible re-operation to address said risks. The risks of general anesthetic, if used, includes MI, CVA, sudden death or even reaction to anesthetic medications also discussed.  Typical post-op recovery time of 3-5 days with possible activity restrictions were also discussed.  The patient verbalized understanding and all questions were answered to the patient's satisfaction.  2. Patient has elected to proceed with surgical treatment. Procedure will be scheduled, in OR due to location and degree of discomfort.  We also specifically discussed remotely possibility of eyelid complications due to the proximity to it.  I initially offered second opinion with ophthalmologist, but she declined and insisted we proceed with removal quickly.  Will proceed.  Also discussed smoking cessation immediately to prevent future recurrences and post op  complications.  She acknowledged and will try to cut down.  labs/images/medications/previous chart entries reviewed personally and relevant changes/updates noted above.

## 2024-04-21 ENCOUNTER — Emergency Department: Admission: EM | Admit: 2024-04-21 | Discharge: 2024-04-21 | Discharge status: Against Medical Advice | Payer: MEDICAID

## 2024-04-21 NOTE — ED Notes (Signed)
 Pt called for triage. Pt not visualized in the lobby. Another pt states that she thinks pt may have left.

## 2024-04-21 NOTE — ED Notes (Signed)
 Called for triage. Pt not in lobby.

## 2024-04-21 NOTE — ED Notes (Signed)
 Pt called for treatment room. Pt did not answer x 3. Pt not visualized in the lobby.

## 2024-04-28 ENCOUNTER — Encounter
Admission: RE | Admit: 2024-04-28 | Discharge: 2024-04-28 | Disposition: A | Discharge status: Home / Self Care | Payer: MEDICAID | Source: Ambulatory Visit | Attending: Surgery | Admitting: Surgery

## 2024-04-28 ENCOUNTER — Other Ambulatory Visit: Payer: Self-pay

## 2024-04-28 HISTORY — DX: Dyspnea, unspecified: R06.00

## 2024-04-28 HISTORY — DX: Prediabetes: R73.03

## 2024-04-28 HISTORY — DX: Unspecified osteoarthritis, unspecified site: M19.90

## 2024-04-28 HISTORY — DX: Anemia, unspecified: D64.9

## 2024-04-28 HISTORY — DX: Pneumonia, unspecified organism: J18.9

## 2024-04-28 HISTORY — DX: Epidermal cyst: L72.0

## 2024-04-28 NOTE — Patient Instructions (Addendum)
 Your procedure is scheduled on: 04/30/24 - Thursday Report to the Registration Desk on the 1st floor of the [facility]. To find out your arrival time, please call [phone] between 1PM - 3PM on: 04/29/24 - Wednesday If your arrival time is 6:00 am, do not arrive before that time as the [facility] entrance doors do not open until 6:00 am.  REMEMBER: Instructions that are not followed completely may result in serious medical risk, up to and including death; or upon the discretion of your surgeon and anesthesiologist your surgery may need to be rescheduled.  Do not eat food after midnight the night before surgery.  No gum chewing or hard candies.  You may however, drink CLEAR liquids up to 2 hours before you are scheduled to arrive for your surgery. Do not drink anything within 2 hours of your scheduled arrival time.  Clear liquids include: - water  - apple juice without pulp - gatorade (not RED colors) - black coffee or tea (Do NOT add milk or creamers to the coffee or tea) Do NOT drink anything that is not on this list.  One week prior to surgery: Stop Anti-inflammatories (NSAIDS) such as Advil , Aleve, Ibuprofen , Motrin , Naproxen, Naprosyn and Aspirin based products such as Excedrin, Goody's Powder, BC Powder. You may take Tylenol  if needed for pain up until the day of surgery.  Stop ANY OVER THE COUNTER supplements until after surgery.  You may take Tylenol  if needed for pain up until the day of surgery.  ON THE DAY OF SURGERY ONLY TAKE THESE MEDICATIONS WITH SIPS OF WATER:  ARIPiprazole  (ABILIFY )  cyclobenzaprine  (AMRIX )  dicyclomine (BENTYL)  fluticasone -salmeterol (ADVAIR  DISKUS)  omeprazole  (PRILOSEC)  oxybutynin  (DITROPAN )   Use inhalers albuterol  (VENTOLIN  HFA) on the day of surgery and bring to the hospital.  No Alcohol for 24 hours before or after surgery.  No Smoking including e-cigarettes for 24 hours before surgery.  No chewable tobacco products for at  least 6 hours before surgery.  No nicotine  patches on the day of surgery.  Do not use any recreational drugs for at least a week (preferably 2 weeks) before your surgery.  Please be advised that the combination of cocaine and anesthesia may have negative outcomes, up to and including death. If you test positive for cocaine, your surgery will be cancelled.  On the morning of surgery brush your teeth with toothpaste and water, you may rinse your mouth with mouthwash if you wish. Do not swallow any toothpaste or mouthwash.  Do not wear jewelry, make-up, hairpins, clips or nail polish.  For welded (permanent) jewelry: bracelets, anklets, waist bands, etc.  Please have this removed prior to surgery.  If it is not removed, there is a chance that hospital personnel will need to cut it off on the day of surgery.  Do not wear lotions, powders, or perfumes.   Do not shave body hair from the neck down 48 hours before surgery.  Contact lenses, hearing aids and dentures may not be worn into surgery.  Do not bring valuables to the hospital. [facility] is not responsible for any missing/lost belongings or valuables.   Notify your doctor if there is any change in your medical condition (cold, fever, infection).  Wear comfortable clothing (specific to your surgery type) to the hospital.  After surgery, you can help prevent lung complications by doing breathing exercises.  Take deep breaths and cough every 1-2 hours. Your doctor may order a device called an Facilities Manager to  help you take deep breaths.  When coughing or sneezing, hold a pillow firmly against your incision with both hands. This is called "splinting." Doing this helps protect your incision. It also decreases belly discomfort.  If you are being admitted to the hospital overnight, leave your suitcase in the car. After surgery it may be brought to your room.  In case of increased patient census, it may be necessary for you, the  patient, to continue your postoperative care in the Same Day Surgery department.  If you are being discharged the day of surgery, you will not be allowed to drive home. You will need a responsible individual to drive you home and stay with you for 24 hours after surgery.   If you are taking public transportation, you will need to have a responsible individual with you.  Please call the Pre-admissions Testing Dept. at [phone] if you have any questions about these instructions.  Surgery Visitation Policy:  Patients having surgery or a procedure may have two visitors.  Children under the age of 51 must have an adult with them who is not the patient.  Inpatient Visitation:    Visiting hours are 7 a.m. to 8 p.m. Up to four visitors are allowed at one time in a patient room. The visitors may rotate out with other people during the day.  One visitor age 54 or older may stay with the patient overnight and must be in the room by 8 p.m.   Merchandiser, Retail to address health-related social needs:  [email or web address]

## 2024-04-30 ENCOUNTER — Ambulatory Visit: Payer: MEDICAID | Admitting: Certified Registered Nurse Anesthetist

## 2024-04-30 ENCOUNTER — Encounter: Payer: Self-pay | Admitting: Surgery

## 2024-04-30 ENCOUNTER — Encounter: Admission: RE | Disposition: A | Payer: Self-pay | Source: Home / Self Care | Attending: Surgery

## 2024-04-30 ENCOUNTER — Ambulatory Visit: Payer: MEDICAID | Admitting: Urgent Care

## 2024-04-30 ENCOUNTER — Other Ambulatory Visit: Payer: Self-pay

## 2024-04-30 ENCOUNTER — Ambulatory Visit
Admission: RE | Admit: 2024-04-30 | Discharge: 2024-04-30 | Disposition: A | Discharge status: Home / Self Care | Payer: MEDICAID | Attending: Surgery | Admitting: Surgery

## 2024-04-30 ENCOUNTER — Other Ambulatory Visit: Payer: Self-pay | Admitting: Surgery

## 2024-04-30 DIAGNOSIS — F319 Bipolar disorder, unspecified: Secondary | ICD-10-CM | POA: Insufficient documentation

## 2024-04-30 DIAGNOSIS — L720 Epidermal cyst: Secondary | ICD-10-CM | POA: Diagnosis present

## 2024-04-30 DIAGNOSIS — K219 Gastro-esophageal reflux disease without esophagitis: Secondary | ICD-10-CM | POA: Diagnosis not present

## 2024-04-30 DIAGNOSIS — J4489 Other specified chronic obstructive pulmonary disease: Secondary | ICD-10-CM | POA: Insufficient documentation

## 2024-04-30 DIAGNOSIS — F17210 Nicotine dependence, cigarettes, uncomplicated: Secondary | ICD-10-CM | POA: Diagnosis not present

## 2024-04-30 HISTORY — PX: EXCISION MASS HEAD: SHX6702

## 2024-04-30 SURGERY — EXCISION, MASS, HEAD
Anesthesia: General | Site: Face | Laterality: Left

## 2024-04-30 MED ORDER — GABAPENTIN 300 MG PO CAPS
ORAL_CAPSULE | ORAL | Status: AC
  Filled 2024-04-30: qty 1

## 2024-04-30 MED ORDER — OXYCODONE HCL 5 MG/5ML PO SOLN: 5.0000 mg | Freq: Once | ORAL | Status: DC | PRN

## 2024-04-30 MED ORDER — CHLORHEXIDINE GLUCONATE CLOTH 2 % EX PADS: 6.0000 | MEDICATED_PAD | Freq: Once | CUTANEOUS | Status: DC

## 2024-04-30 MED ORDER — MIDAZOLAM HCL (PF) 2 MG/2ML IJ SOLN
INTRAMUSCULAR | Status: DC | PRN
  Administered 2024-04-30: 2 mg via INTRAVENOUS

## 2024-04-30 MED ORDER — ONDANSETRON HCL 4 MG/2ML IJ SOLN
INTRAMUSCULAR | Status: AC
Start: 2024-04-30 — End: 2024-04-30
  Filled 2024-04-30: qty 2

## 2024-04-30 MED ORDER — CHLORHEXIDINE GLUCONATE 0.12 % MT SOLN
15.0000 mL | Freq: Once | OROMUCOSAL | Status: AC
  Administered 2024-04-30: 15 mL via OROMUCOSAL

## 2024-04-30 MED ORDER — BUPIVACAINE-EPINEPHRINE 0.5% -1:200000 IJ SOLN
INTRAMUSCULAR | Status: DC | PRN
  Administered 2024-04-30: 1 mL

## 2024-04-30 MED ORDER — FENTANYL CITRATE (PF) 100 MCG/2ML IJ SOLN: 25.0000 ug | INTRAMUSCULAR | Status: DC | PRN

## 2024-04-30 MED ORDER — MIDAZOLAM HCL 2 MG/2ML IJ SOLN
INTRAMUSCULAR | Status: AC
  Filled 2024-04-30: qty 2

## 2024-04-30 MED ORDER — DEXMEDETOMIDINE HCL IN NACL 80 MCG/20ML IV SOLN
INTRAVENOUS | Status: DC | PRN
  Administered 2024-04-30: 8 ug via INTRAVENOUS

## 2024-04-30 MED ORDER — GABAPENTIN 300 MG PO CAPS
300.0000 mg | ORAL_CAPSULE | ORAL | Status: AC
  Administered 2024-04-30: 300 mg via ORAL

## 2024-04-30 MED ORDER — CHLORHEXIDINE GLUCONATE 0.12 % MT SOLN
OROMUCOSAL | Status: AC
  Filled 2024-04-30: qty 15

## 2024-04-30 MED ORDER — FENTANYL CITRATE (PF) 100 MCG/2ML IJ SOLN
INTRAMUSCULAR | Status: AC
  Filled 2024-04-30: qty 2

## 2024-04-30 MED ORDER — OXYCODONE HCL 5 MG PO TABS: 5.0000 mg | ORAL_TABLET | Freq: Three times a day (TID) | ORAL | 0 refills | Status: DC | PRN

## 2024-04-30 MED ORDER — PROPOFOL 10 MG/ML IV BOLUS
INTRAVENOUS | Status: AC
Start: 2024-04-30 — End: 2024-04-30
  Filled 2024-04-30: qty 40

## 2024-04-30 MED ORDER — BUPIVACAINE-EPINEPHRINE (PF) 0.5% -1:200000 IJ SOLN
INTRAMUSCULAR | Status: AC
  Filled 2024-04-30: qty 30

## 2024-04-30 MED ORDER — CEFAZOLIN SODIUM-DEXTROSE 2-4 GM/100ML-% IV SOLN
2.0000 g | INTRAVENOUS | Status: AC
  Administered 2024-04-30: 2 g via INTRAVENOUS

## 2024-04-30 MED ORDER — DOCUSATE SODIUM 100 MG PO CAPS: 100.0000 mg | ORAL_CAPSULE | Freq: Two times a day (BID) | ORAL | 0 refills | Status: AC | PRN

## 2024-04-30 MED ORDER — ACETAMINOPHEN 10 MG/ML IV SOLN: 1000.0000 mg | Freq: Once | INTRAVENOUS | Status: DC | PRN

## 2024-04-30 MED ORDER — PROPOFOL 500 MG/50ML IV EMUL
INTRAVENOUS | Status: DC | PRN
  Administered 2024-04-30: 150 ug/kg/min via INTRAVENOUS

## 2024-04-30 MED ORDER — CELECOXIB 200 MG PO CAPS
ORAL_CAPSULE | ORAL | Status: AC
Start: 2024-04-30 — End: 2024-04-30
  Filled 2024-04-30: qty 1

## 2024-04-30 MED ORDER — GLYCOPYRROLATE 0.2 MG/ML IJ SOLN
INTRAMUSCULAR | Status: AC
  Filled 2024-04-30: qty 1

## 2024-04-30 MED ORDER — LIDOCAINE HCL (PF) 2 % IJ SOLN
INTRAMUSCULAR | Status: DC | PRN
Start: 2024-04-30 — End: 2024-04-30
  Administered 2024-04-30: 100 mg via INTRADERMAL

## 2024-04-30 MED ORDER — ACETAMINOPHEN 500 MG PO TABS
1000.0000 mg | ORAL_TABLET | ORAL | Status: AC
  Administered 2024-04-30: 1000 mg via ORAL

## 2024-04-30 MED ORDER — OXYCODONE HCL 5 MG PO TABS: 5.0000 mg | ORAL_TABLET | Freq: Three times a day (TID) | ORAL | 0 refills | Status: AC | PRN

## 2024-04-30 MED ORDER — PROPOFOL 10 MG/ML IV BOLUS
INTRAVENOUS | Status: AC
  Filled 2024-04-30: qty 40

## 2024-04-30 MED ORDER — LACTATED RINGERS IV SOLN: INTRAVENOUS | Status: DC

## 2024-04-30 MED ORDER — CELECOXIB 200 MG PO CAPS
200.0000 mg | ORAL_CAPSULE | ORAL | Status: AC
  Administered 2024-04-30: 200 mg via ORAL

## 2024-04-30 MED ORDER — FENTANYL CITRATE (PF) 100 MCG/2ML IJ SOLN
INTRAMUSCULAR | Status: DC | PRN
  Administered 2024-04-30: 50 ug via INTRAVENOUS

## 2024-04-30 MED ORDER — 0.9 % SODIUM CHLORIDE (POUR BTL) OPTIME
TOPICAL | Status: DC | PRN
  Administered 2024-04-30: 500 mL

## 2024-04-30 MED ORDER — OXYCODONE HCL 5 MG PO TABS: 5.0000 mg | ORAL_TABLET | Freq: Once | ORAL | Status: DC | PRN

## 2024-04-30 MED ORDER — ORAL CARE MOUTH RINSE: 15.0000 mL | Freq: Once | OROMUCOSAL | Status: AC

## 2024-04-30 MED ORDER — IBUPROFEN 800 MG PO TABS: 800.0000 mg | ORAL_TABLET | Freq: Three times a day (TID) | ORAL | 0 refills | Status: AC | PRN

## 2024-04-30 MED ORDER — LIDOCAINE HCL (PF) 2 % IJ SOLN
INTRAMUSCULAR | Status: AC
Start: 2024-04-30 — End: 2024-04-30
  Filled 2024-04-30: qty 5

## 2024-04-30 MED ORDER — PROPOFOL 10 MG/ML IV BOLUS
INTRAVENOUS | Status: DC | PRN
Start: 2024-04-30 — End: 2024-04-30
  Administered 2024-04-30: 50 mg via INTRAVENOUS

## 2024-04-30 MED ORDER — CEFAZOLIN SODIUM-DEXTROSE 2-4 GM/100ML-% IV SOLN
INTRAVENOUS | Status: AC
  Filled 2024-04-30: qty 100

## 2024-04-30 MED ORDER — ACETAMINOPHEN 325 MG PO TABS: 650.0000 mg | ORAL_TABLET | Freq: Three times a day (TID) | ORAL | 0 refills | Status: AC | PRN

## 2024-04-30 MED ORDER — ACETAMINOPHEN 500 MG PO TABS
ORAL_TABLET | ORAL | Status: AC
  Filled 2024-04-30: qty 2

## 2024-04-30 MED ORDER — DROPERIDOL 2.5 MG/ML IJ SOLN: 0.6250 mg | Freq: Once | INTRAMUSCULAR | Status: DC | PRN

## 2024-04-30 SURGICAL SUPPLY — 24 items
BLADE SURG 15 STRL LF DISP TIS (BLADE) ×1 IMPLANT
DERMABOND ADVANCED .7 DNX12 (GAUZE/BANDAGES/DRESSINGS) ×1 IMPLANT
DRAPE LAPAROTOMY 77X122 PED (DRAPES) ×1 IMPLANT
ELECTRODE REM PT RTRN 9FT ADLT (ELECTROSURGICAL) ×1 IMPLANT
GAUZE 4X4 16PLY ~~LOC~~+RFID DBL (SPONGE) ×1 IMPLANT
GLOVE BIOGEL PI IND STRL 7.0 (GLOVE) ×1 IMPLANT
GLOVE SURG SYN 6.5 PF PI (GLOVE) ×3 IMPLANT
GOWN STRL REUS W/ TWL LRG LVL3 (GOWN DISPOSABLE) ×3 IMPLANT
KIT TURNOVER KIT A (KITS) ×1 IMPLANT
LABEL OR SOLS (LABEL) ×1 IMPLANT
MANIFOLD NEPTUNE II (INSTRUMENTS) ×1 IMPLANT
NDL HYPO 22X1.5 SAFETY MO (MISCELLANEOUS) ×1 IMPLANT
NEEDLE HYPO 22X1.5 SAFETY MO (MISCELLANEOUS) ×1 IMPLANT
NS IRRIG 500ML POUR BTL (IV SOLUTION) ×1 IMPLANT
PACK BASIN MINOR ARMC (MISCELLANEOUS) ×1 IMPLANT
SOLUTION PREP PVP 2OZ (MISCELLANEOUS) ×1 IMPLANT
STRIP CLOSURE SKIN 1/4X4 (GAUZE/BANDAGES/DRESSINGS) IMPLANT
SUT VIC AB 3-0 SH 27X BRD (SUTURE) ×1 IMPLANT
SUTURE MNCRL 4-0 27XMF (SUTURE) ×1 IMPLANT
SYR 30ML LL (SYRINGE) ×1 IMPLANT
SYR BULB IRRIG 60ML STRL (SYRINGE) ×1 IMPLANT
TOWEL OR 17X26 4PK STRL BLUE (TOWEL DISPOSABLE) ×1 IMPLANT
TRAP FLUID SMOKE EVACUATOR (MISCELLANEOUS) ×1 IMPLANT
WATER STERILE IRR 500ML POUR (IV SOLUTION) ×1 IMPLANT

## 2024-04-30 NOTE — Transfer of Care (Signed)
 Immediate Anesthesia Transfer of Care Note  Patient: Kim Howard  Procedure(s) Performed: EXCISION, MASS, HEAD (Left: Face)  Patient Location: PACU  Anesthesia Type:General  Level of Consciousness: awake, alert , and drowsy  Airway & Oxygen Therapy: Patient Spontanous Breathing and Patient connected to nasal cannula oxygen  Post-op Assessment: Report given to RN and Post -op Vital signs reviewed and stable  Post vital signs: Reviewed and stable  Last Vitals:  Vitals Value Taken Time  BP 116/86 04/30/24 11:09  Temp    Pulse 95 04/30/24 11:11  Resp 9 04/30/24 11:11  SpO2 99 % 04/30/24 11:11  Vitals shown include unfiled device data.  Last Pain:  Vitals:   04/30/24 1002  TempSrc: Temporal  PainSc: 0-No pain         Complications: No notable events documented.

## 2024-04-30 NOTE — Anesthesia Procedure Notes (Signed)
 Procedure Name: MAC Date/Time: 04/30/2024 10:45 AM  Performed by: Lacretia Camelia NOVAK, CRNAPre-anesthesia Checklist: Patient identified, Emergency Drugs available, Suction available and Patient being monitored Patient Re-evaluated:Patient Re-evaluated prior to induction Oxygen Delivery Method: Nasal cannula

## 2024-04-30 NOTE — Anesthesia Preprocedure Evaluation (Addendum)
 Anesthesia Evaluation  Patient identified by MRN, date of birth, ID band Patient awake    Reviewed: Allergy & Precautions, H&P , NPO status , Patient's Chart, lab work & pertinent test results  Airway Mallampati: II  TM Distance: >3 FB Neck ROM: full    Dental no notable dental hx.    Pulmonary shortness of breath, COPD (exacerbation 01/2024), Current Smoker   Pulmonary exam normal        Cardiovascular negative cardio ROS Normal cardiovascular exam     Neuro/Psych  PSYCHIATRIC DISORDERS   Bipolar Disorder   negative neurological ROS     GI/Hepatic Neg liver ROS,GERD  ,,  Endo/Other  negative endocrine ROS    Renal/GU      Musculoskeletal   Abdominal   Peds  Hematology negative hematology ROS (+)   Anesthesia Other Findings Past Medical History: No date: Anemia No date: Anxiety No date: Arthritis No date: Asthma No date: B12 deficiency No date: Bipolar affective disorder, currently depressed, mild (HCC) No date: COPD (chronic obstructive pulmonary disease) (HCC) No date: Depression No date: Dyspnea No date: Epidermal cyst of face No date: GERD (gastroesophageal reflux disease) No date: Helicobacter pylori gastritis No date: Hyperlipidemia No date: Irritable bowel syndrome (IBS) No date: Neuropathy No date: Pneumonia No date: Pre-diabetes No date: Tobacco use No date: Urinary incontinence No date: Vitamin D deficiency  Past Surgical History: 01/08/2018: ESOPHAGOGASTRODUODENOSCOPY (EGD) WITH PROPOFOL ; N/A     Comment:  Procedure: ESOPHAGOGASTRODUODENOSCOPY (EGD) WITH               PROPOFOL ;  Surgeon: Viktoria Lamar DASEN, MD;  Location:               [facility] ENDOSCOPY;  Service: Endoscopy;  Laterality: N/A; No date: INCISE AND DRAIN ABCESS     Reproductive/Obstetrics negative OB ROS                              Anesthesia Physical Anesthesia Plan  ASA: 3  Anesthesia Plan:  General   Post-op Pain Management: Regional block*   Induction: Intravenous  PONV Risk Score and Plan: 2 and Ondansetron , Dexamethasone, Midazolam and Propofol  infusion  Airway Management Planned: Natural Airway  Additional Equipment:   Intra-op Plan:   Post-operative Plan:   Informed Consent: I have reviewed the patients History and Physical, chart, labs and discussed the procedure including the risks, benefits and alternatives for the proposed anesthesia with the patient or authorized representative who has indicated his/her understanding and acceptance.     Dental Advisory Given  Plan Discussed with: CRNA and Surgeon  Anesthesia Plan Comments:          Anesthesia Quick Evaluation

## 2024-04-30 NOTE — Anesthesia Postprocedure Evaluation (Signed)
 Anesthesia Post Note  Patient: Kim Howard  Procedure(s) Performed: EXCISION, MASS, HEAD (Left: Face)  Patient location during evaluation: PACU Anesthesia Type: General Level of consciousness: awake and alert Pain management: pain level controlled Vital Signs Assessment: post-procedure vital signs reviewed and stable Respiratory status: spontaneous breathing, nonlabored ventilation and respiratory function stable Cardiovascular status: blood pressure returned to baseline and stable Postop Assessment: no apparent nausea or vomiting Anesthetic complications: no   No notable events documented.   Last Vitals:  Vitals:   04/30/24 1140 04/30/24 1156  BP:  (!) 146/92  Pulse: 97 71  Resp: 16 18  Temp:  36.7 C  SpO2: 100% 100%    Last Pain:  Vitals:   04/30/24 1156  TempSrc: Temporal  PainSc: 0-No pain                 Camellia Merilee Louder

## 2024-04-30 NOTE — Interval H&P Note (Signed)
 No change. Ok to proceed

## 2024-04-30 NOTE — Discharge Instructions (Signed)
 Removal, Care After This sheet gives you information about how to care for yourself after your procedure. Your health care provider may also give you more specific instructions. If you have problems or questions, contact your health care provider. What can I expect after the procedure? After the procedure, it is common to have: Soreness. Bruising. Itching. Follow these instructions at home: site care Follow instructions from your health care provider about how to take care of your site. Make sure you: Wash your hands with soap and water before and after you change your bandage (dressing). If soap and water are not available, use hand sanitizer. Leave stitches (sutures), skin glue, or adhesive strips in place. These skin closures may need to stay in place for 2 weeks or longer. If adhesive strip edges start to loosen and curl up, you may trim the loose edges. Do not remove adhesive strips completely unless your health care provider tells you to do that. If the area bleeds or bruises, apply gentle pressure for 10 minutes. OK TO SHOWER IN 24HRS  Check your site every day for signs of infection. Check for: Redness, swelling, or pain. Fluid or blood. Warmth. Pus or a bad smell.  General instructions Rest and then return to your normal activities as told by your health care provider.  tylenol and advil as needed for discomfort.  Please alternate between the two every four hours as needed for pain.    Use narcotics, if prescribed, only when tylenol and motrin is not enough to control pain.  325-650mg  every 8hrs to max of 3000mg /24hrs (including the 325mg  in every norco dose) for the tylenol.    Advil up to 800mg  per dose every 8hrs as needed for pain.   Keep all follow-up visits as told by your health care provider. This is important. Contact a health care provider if: You have redness, swelling, or pain around your site. You have fluid or blood coming from your site. Your site feels warm to  the touch. You have pus or a bad smell coming from your site. You have a fever. Your sutures, skin glue, or adhesive strips loosen or come off sooner than expected. Get help right away if: You have bleeding that does not stop with pressure or a dressing. Summary After the procedure, it is common to have some soreness, bruising, and itching at the site. Follow instructions from your health care provider about how to take care of your site. Check your site every day for signs of infection. Contact a health care provider if you have redness, swelling, or pain around your site, or your site feels warm to the touch. Keep all follow-up visits as told by your health care provider. This is important. This information is not intended to replace advice given to you by your health care provider. Make sure you discuss any questions you have with your health care provider. Document Released: 07/15/2015 Document Revised: 12/16/2017 Document Reviewed: 12/16/2017 Elsevier Interactive Patient Education  [employer].

## 2024-04-30 NOTE — Op Note (Signed)
 Pre-Op Dx: face cyst Post-Op Dx: same Anesthesia: MAC EBL: 2 mL Complications:  none apparent Specimen: Face chest Procedure: excisional biopsy of left face cyst Surgeon: Tye  Indications for procedure: See H&P  Description of Procedure:  Consent obtained, time out performed.  Patient placed in supine position.  Area sterilized and draped in usual position.  Local infused to area previously marked.  1 cm incision made through dermis with 15blade and cyst noted in subcutaneous layer.  The  5 mm x 3 mm x 3 mm cyst then removed from surrounding tissue completely using scissors, passed off field pending pathology.  Wound hemostasis noted, then closed with 4-0 monocryl in subcuticular fashion for epidermal layer.  Wound then dressed with Steri-Strips.  Pt tolerated procedure well, and transferred to PACU in stable condition. Sponge and instrument count correct at end of procedure.

## 2024-05-01 ENCOUNTER — Encounter: Payer: Self-pay | Admitting: Surgery

## 2024-05-01 LAB — SURGICAL PATHOLOGY
